# Patient Record
Sex: Female | Born: 1950 | Race: White | Hispanic: No | Smoking: Former smoker
Health system: Southern US, Community
[De-identification: ages and names within clinical notes are randomized; demographics above are authoritative.]

## PROBLEM LIST (undated history)

## (undated) DIAGNOSIS — K219 Gastro-esophageal reflux disease without esophagitis: Secondary | ICD-10-CM

## (undated) DIAGNOSIS — J45909 Unspecified asthma, uncomplicated: Secondary | ICD-10-CM

## (undated) DIAGNOSIS — I10 Essential (primary) hypertension: Secondary | ICD-10-CM

## (undated) DIAGNOSIS — M797 Fibromyalgia: Secondary | ICD-10-CM

## (undated) DIAGNOSIS — R519 Headache, unspecified: Secondary | ICD-10-CM

## (undated) DIAGNOSIS — K759 Inflammatory liver disease, unspecified: Secondary | ICD-10-CM

## (undated) HISTORY — PX: ABDOMINAL HYSTERECTOMY: SHX81

## (undated) HISTORY — PX: TUBAL LIGATION: SHX77

---

## 2005-05-10 ENCOUNTER — Ambulatory Visit: Payer: Self-pay | Admitting: Unknown Physician Specialty

## 2006-11-28 ENCOUNTER — Ambulatory Visit: Payer: Self-pay | Admitting: Family Medicine

## 2008-01-18 ENCOUNTER — Ambulatory Visit: Payer: Self-pay | Admitting: Family Medicine

## 2008-05-01 ENCOUNTER — Ambulatory Visit: Payer: Self-pay | Admitting: Obstetrics and Gynecology

## 2008-05-06 ENCOUNTER — Inpatient Hospital Stay: Payer: Self-pay | Admitting: Obstetrics and Gynecology

## 2009-01-27 ENCOUNTER — Ambulatory Visit: Payer: Self-pay | Admitting: Family Medicine

## 2010-01-29 ENCOUNTER — Ambulatory Visit: Payer: Self-pay | Admitting: Nurse Practitioner

## 2011-03-22 ENCOUNTER — Ambulatory Visit: Payer: Self-pay | Admitting: Family Medicine

## 2011-06-08 ENCOUNTER — Ambulatory Visit: Payer: Self-pay

## 2011-07-12 ENCOUNTER — Ambulatory Visit: Payer: Self-pay | Admitting: Neurology

## 2011-07-12 LAB — CREATININE, SERUM
EGFR (African American): >60
EGFR (Non-African Amer.): >60

## 2012-03-22 ENCOUNTER — Ambulatory Visit: Payer: Self-pay | Admitting: Nurse Practitioner

## 2012-03-30 ENCOUNTER — Ambulatory Visit: Payer: Self-pay | Admitting: Nurse Practitioner

## 2012-09-21 ENCOUNTER — Ambulatory Visit: Payer: Self-pay | Admitting: Nurse Practitioner

## 2012-10-03 ENCOUNTER — Ambulatory Visit: Payer: Self-pay | Admitting: Nurse Practitioner

## 2013-09-25 ENCOUNTER — Ambulatory Visit: Payer: Self-pay | Admitting: Nurse Practitioner

## 2013-10-01 ENCOUNTER — Ambulatory Visit: Payer: Self-pay

## 2013-10-12 ENCOUNTER — Ambulatory Visit: Payer: Self-pay

## 2014-09-13 ENCOUNTER — Other Ambulatory Visit: Payer: Self-pay | Admitting: Nurse Practitioner

## 2014-09-13 DIAGNOSIS — Z1239 Encounter for other screening for malignant neoplasm of breast: Secondary | ICD-10-CM

## 2014-10-01 ENCOUNTER — Ambulatory Visit: Payer: Self-pay

## 2014-10-02 ENCOUNTER — Ambulatory Visit
Admission: RE | Admit: 2014-10-02 | Discharge: 2014-10-02 | Disposition: A | Discharge status: Home / Self Care | Payer: BC Managed Care – PPO | Source: Ambulatory Visit | Attending: Nurse Practitioner | Admitting: Nurse Practitioner

## 2014-10-02 DIAGNOSIS — Z1231 Encounter for screening mammogram for malignant neoplasm of breast: Secondary | ICD-10-CM | POA: Insufficient documentation

## 2014-10-02 DIAGNOSIS — Z1239 Encounter for other screening for malignant neoplasm of breast: Secondary | ICD-10-CM

## 2015-10-28 ENCOUNTER — Other Ambulatory Visit: Payer: Self-pay | Admitting: Nurse Practitioner

## 2015-10-28 DIAGNOSIS — Z1231 Encounter for screening mammogram for malignant neoplasm of breast: Secondary | ICD-10-CM

## 2015-11-06 ENCOUNTER — Ambulatory Visit
Admission: RE | Admit: 2015-11-06 | Discharge: 2015-11-06 | Disposition: A | Discharge status: Home / Self Care | Payer: BC Managed Care – PPO | Source: Ambulatory Visit | Attending: Nurse Practitioner | Admitting: Nurse Practitioner

## 2015-11-06 DIAGNOSIS — Z1231 Encounter for screening mammogram for malignant neoplasm of breast: Secondary | ICD-10-CM | POA: Diagnosis present

## 2016-09-30 ENCOUNTER — Other Ambulatory Visit: Payer: Self-pay | Admitting: Nurse Practitioner

## 2016-09-30 DIAGNOSIS — Z1231 Encounter for screening mammogram for malignant neoplasm of breast: Secondary | ICD-10-CM

## 2016-11-09 ENCOUNTER — Ambulatory Visit
Admission: RE | Admit: 2016-11-09 | Discharge: 2016-11-09 | Disposition: A | Discharge status: Home / Self Care | Payer: Medicare Other | Source: Ambulatory Visit | Attending: Nurse Practitioner | Admitting: Nurse Practitioner

## 2016-11-09 DIAGNOSIS — Z1231 Encounter for screening mammogram for malignant neoplasm of breast: Secondary | ICD-10-CM

## 2017-01-18 ENCOUNTER — Emergency Department: Payer: Medicare Other

## 2017-01-18 ENCOUNTER — Emergency Department
Admission: EM | Admit: 2017-01-18 | Discharge: 2017-01-18 | Disposition: A | Discharge status: Home / Self Care | Payer: Medicare Other | Attending: Student in an Organized Health Care Education/Training Program | Admitting: Student in an Organized Health Care Education/Training Program

## 2017-01-18 ENCOUNTER — Encounter: Payer: Self-pay | Admitting: Emergency Medicine

## 2017-01-18 DIAGNOSIS — R079 Chest pain, unspecified: Secondary | ICD-10-CM | POA: Diagnosis present

## 2017-01-18 DIAGNOSIS — I10 Essential (primary) hypertension: Secondary | ICD-10-CM | POA: Diagnosis not present

## 2017-01-18 HISTORY — DX: Fibromyalgia: M79.7

## 2017-01-18 HISTORY — DX: Essential (primary) hypertension: I10

## 2017-01-18 HISTORY — DX: Gastro-esophageal reflux disease without esophagitis: K21.9

## 2017-01-18 LAB — CBC
HCT: 44.5 % (ref 35.0–47.0)
Hemoglobin: 15.4 g/dL (ref 12.0–16.0)
MCH: 33.6 pg (ref 26.0–34.0)
MCHC: 34.6 g/dL (ref 32.0–36.0)
MCV: 97.1 fL (ref 80.0–100.0)
Platelets: 263 10*3/uL (ref 150–440)
RBC: 4.58 MIL/uL (ref 3.80–5.20)
RDW: 13.3 % (ref 11.5–14.5)
WBC: 5.7 10*3/uL (ref 3.6–11.0)

## 2017-01-18 LAB — BASIC METABOLIC PANEL
Anion gap: 8 (ref 5–15)
BUN: 18 mg/dL (ref 6–20)
CALCIUM: 9.3 mg/dL (ref 8.9–10.3)
CO2: 28 mmol/L (ref 22–32)
CREATININE: 0.75 mg/dL (ref 0.44–1.00)
Chloride: 100 mmol/L — ABNORMAL LOW (ref 101–111)
GFR calc Af Amer: >60 mL/min (ref >60)
GLUCOSE: 92 mg/dL (ref 65–99)
Potassium: 3.8 mmol/L (ref 3.5–5.1)
Sodium: 136 mmol/L (ref 135–145)

## 2017-01-18 LAB — FIBRIN DERIVATIVES D-DIMER (ARMC ONLY): FIBRIN DERIVATIVES D-DIMER (ARMC): 312.2 (ref 0.00–499.00)

## 2017-01-18 LAB — TROPONIN I: Troponin I: <0.03 ng/mL (ref <0.03)

## 2017-01-18 MED ORDER — AMLODIPINE BESYLATE 5 MG PO TABS: 5.0000 mg | ORAL_TABLET | Freq: Once | ORAL | Status: DC

## 2017-01-18 NOTE — ED Triage Notes (Signed)
Arrives with c/o intermittent chest pains today.  States pains worse when taking a deep breath.

## 2017-01-18 NOTE — ED Notes (Signed)
Warm blankets provided to pt and family

## 2017-01-18 NOTE — ED Provider Notes (Signed)
[facility] Emergency Department Provider Note    First MD Initiated Contact with Patient 01/18/17 2051     (approximate)  I have reviewed the triage vital signs and the nursing notes.   HISTORY  Chief Complaint Chest Pain    HPI Madeline Powell is a 66 y.o. female presents with chief complaint of sharp stabbing midsternal chest pain without radiation that started this morning. Patient states that pain lasts roughly seconds to minutes. There is no aching or pressure. No fevers or shortness of breath. States she's had multiple of these episodes throughout the day. No palpitations. No pain or discomfort radiating to her jaw or neck. Denies any abdominal pain. No constipation or diarrhea. No previous history of heart attacks. Does have a history of hypertension no history of diabetes or colic cholesterol. She does not smoke. Does have a history of fibromyalgia.   Past Medical History:  Diagnosis Date  . Fibromyalgia   . GERD (gastroesophageal reflux disease)   . Hypertension    Family History  Problem Relation Age of Onset  . Breast cancer Neg Hx    Past Surgical History:  Procedure Laterality Date  . ABDOMINAL HYSTERECTOMY    . TUBAL LIGATION     There are no active problems to display for this patient.     Prior to Admission medications   Not on File    Allergies Dilaudid [hydromorphone hcl] and Morphine and related    Social History Social History  Substance Use Topics  . Smoking status: Never Smoker  . Smokeless tobacco: Never Used  . Alcohol use Not on file    Review of Systems Patient denies headaches, rhinorrhea, blurry vision, numbness, shortness of breath, chest pain, edema, cough, abdominal pain, nausea, vomiting, diarrhea, dysuria, fevers, rashes or hallucinations unless otherwise stated above in HPI. ____________________________________________   PHYSICAL EXAM:  VITAL SIGNS: Vitals:   01/18/17 2230 01/18/17 2318    BP: 130/88 (!) 155/89  Pulse: 70 72  Resp:  16  Temp:    SpO2: 98% 94%    Constitutional: Alert and oriented. Well appearing and in no acute distress. Eyes: Conjunctivae are normal.  Head: Atraumatic. Nose: No congestion/rhinnorhea. Mouth/Throat: Mucous membranes are moist.   Neck: No stridor. Painless ROM.  Cardiovascular: Normal rate, regular rhythm. Grossly normal heart sounds.  Good peripheral circulation.  + ttp of midsternum Respiratory: Normal respiratory effort.  No retractions. Lungs CTAB. Gastrointestinal: Soft and nontender. No distention. No abdominal bruits. No CVA tenderness. Genitourinary:  Musculoskeletal: No lower extremity tenderness nor edema.  No joint effusions. Neurologic:  Normal speech and language. No gross focal neurologic deficits are appreciated. No facial droop Skin:  Skin is warm, dry and intact. No rash noted. Psychiatric: Mood and affect are normal. Speech and behavior are normal.  ____________________________________________   LABS (all labs ordered are listed, but only abnormal results are displayed)  Results for orders placed or performed during the hospital encounter of 01/18/17 (from the past 24 hour(s))  Basic metabolic panel     Status: Abnormal   Collection Time: 01/18/17  7:25 PM  Result Value Ref Range   Sodium 136 135 - 145 mmol/L   Potassium 3.8 3.5 - 5.1 mmol/L   Chloride 100 (L) 101 - 111 mmol/L   CO2 28 22 - 32 mmol/L   Glucose, Bld 92 65 - 99 mg/dL   BUN 18 6 - 20 mg/dL   Creatinine, Ser 1.61 0.44 - 1.00 mg/dL   Calcium  9.3 8.9 - 10.3 mg/dL   GFR calc non Af Amer >60 >60 mL/min   GFR calc Af Amer >60 >60 mL/min   Anion gap 8 5 - 15  CBC     Status: None   Collection Time: 01/18/17  7:25 PM  Result Value Ref Range   WBC 5.7 3.6 - 11.0 K/uL   RBC 4.58 3.80 - 5.20 MIL/uL   Hemoglobin 15.4 12.0 - 16.0 g/dL   HCT 40.9 81.1 - 91.4 %   MCV 97.1 80.0 - 100.0 fL   MCH 33.6 26.0 - 34.0 pg   MCHC 34.6 32.0 - 36.0 g/dL   RDW  78.2 95.6 - 21.3 %   Platelets 263 150 - 440 K/uL  Troponin I     Status: None   Collection Time: 01/18/17  7:25 PM  Result Value Ref Range   Troponin I <0.03 <0.03 ng/mL  Fibrin derivatives D-Dimer ([facility] only)     Status: None   Collection Time: 01/18/17  9:11 PM  Result Value Ref Range   Fibrin derivatives D-dimer ([facility]) 312.20 0.00 - 499.00  Troponin I     Status: None   Collection Time: 01/18/17  9:11 PM  Result Value Ref Range   Troponin I <0.03 <0.03 ng/mL   ____________________________________________  EKG My review and personal interpretation at Time: 18:54   Indication: chest pain  Rate: 70  Rhythm: sinus Axis: normal Other: normal intervals, no stemi, no depressions, poor rwave progression ____________________________________________  RADIOLOGY  I personally reviewed all radiographic images ordered to evaluate for the above acute complaints and reviewed radiology reports and findings.  These findings were personally discussed with the patient.  Please see medical record for radiology report.  ____________________________________________   PROCEDURES  Procedure(s) performed:  Procedures    Critical Care performed: no ____________________________________________   INITIAL IMPRESSION / ASSESSMENT AND PLAN / ED COURSE  Pertinent labs & imaging results that were available during my care of the patient were reviewed by me and considered in my medical decision making (see chart for details).  DDX: ACS, pericarditis, esophagitis, boerhaaves, pe, dissection, pna, bronchitis, costochondritis   Madeline Powell is a 66 y.o. who presents to the ED with Atypical chest pain as described above. Patient is well-appearing and much younger than stated age.  Patient is AFVSS in ED. Exam as above. Given current presentation have considered the above differential.  Abdominal exam soft and benign.  Heart score of 3 versus 4 based on subjectivity (08657), will further evaluate with  repeat trop  Patient low risk by wells, will further risk stratify with d-dimer.   Clinical Course as of Jan 19 2356  Tue Jan 18, 2017  2238 Hemoglobin: 15.4 [PR]  2312 Repeat trop and d-dimer negative.  Patient remains cehst pain free.  At this point, I do feel that she is appropriate for close follow up with pcp and referral to cardiology.  Have discussed with the patient and available family all diagnostics and treatments performed thus far and all questions were answered to the best of my ability. The patient demonstrates understanding and agreement with plan.   [PR]    Clinical Course User Index [PR] Willy Eddy, MD     ____________________________________________   FINAL CLINICAL IMPRESSION(S) / ED DIAGNOSES  Final diagnoses:  Chest pain, unspecified type      NEW MEDICATIONS STARTED DURING THIS VISIT:  There are no discharge medications for this patient.    Note:  This document was  prepared using Conservation officer, historic buildings and may include unintentional dictation errors.    Willy Eddy, MD 01/18/17 [phone]

## 2017-10-05 ENCOUNTER — Other Ambulatory Visit: Payer: Self-pay | Admitting: Nurse Practitioner

## 2017-10-05 DIAGNOSIS — Z1231 Encounter for screening mammogram for malignant neoplasm of breast: Secondary | ICD-10-CM

## 2017-11-14 ENCOUNTER — Ambulatory Visit
Admission: RE | Admit: 2017-11-14 | Discharge: 2017-11-14 | Disposition: A | Discharge status: Home / Self Care | Payer: Medicare Other | Source: Ambulatory Visit | Attending: Nurse Practitioner | Admitting: Nurse Practitioner

## 2017-11-14 DIAGNOSIS — Z1231 Encounter for screening mammogram for malignant neoplasm of breast: Secondary | ICD-10-CM | POA: Diagnosis not present

## 2018-02-28 HISTORY — PX: REDUCTION MAMMAPLASTY: SUR839

## 2018-10-24 ENCOUNTER — Other Ambulatory Visit: Payer: Self-pay | Admitting: Nurse Practitioner

## 2018-10-24 DIAGNOSIS — Z1231 Encounter for screening mammogram for malignant neoplasm of breast: Secondary | ICD-10-CM

## 2018-11-20 ENCOUNTER — Other Ambulatory Visit: Payer: Self-pay

## 2018-11-20 ENCOUNTER — Ambulatory Visit
Admission: RE | Admit: 2018-11-20 | Discharge: 2018-11-20 | Disposition: A | Discharge status: Home / Self Care | Payer: Medicare Other | Source: Ambulatory Visit | Attending: Nurse Practitioner | Admitting: Nurse Practitioner

## 2018-11-20 ENCOUNTER — Encounter (INDEPENDENT_AMBULATORY_CARE_PROVIDER_SITE_OTHER): Payer: Self-pay

## 2018-11-20 DIAGNOSIS — Z1231 Encounter for screening mammogram for malignant neoplasm of breast: Secondary | ICD-10-CM | POA: Diagnosis present

## 2019-07-06 ENCOUNTER — Ambulatory Visit: Payer: Medicare PPO | Attending: Internal Medicine

## 2019-07-06 DIAGNOSIS — Z23 Encounter for immunization: Secondary | ICD-10-CM

## 2019-07-06 NOTE — Progress Notes (Signed)
   Covid-19 Vaccination Clinic  Name:  Madeline Powell    MRN: [id number] DOB: 09-Apr-1951  07/06/2019  [name] was observed post Covid-19 immunization for 15 minutes without incidence. She was provided with Vaccine Information Sheet and instruction to access the V-Safe system.   [name] was instructed to call 911 with any severe reactions post vaccine: Marland Kitchen Difficulty breathing  . Swelling of your face and throat  . A fast heartbeat  . A bad rash all over your body  . Dizziness and weakness    Immunizations Administered    Name Date Dose VIS Date Route   Moderna COVID-19 Vaccine 07/06/2019  2:11 PM 0.5 mL 05/01/2019 Intramuscular   Manufacturer: Moderna   Lot: [id number]   NDC: 78375-423-70

## 2019-08-07 ENCOUNTER — Ambulatory Visit: Payer: Medicare PPO | Attending: Internal Medicine

## 2019-08-07 DIAGNOSIS — Z23 Encounter for immunization: Secondary | ICD-10-CM | POA: Insufficient documentation

## 2019-08-07 NOTE — Progress Notes (Signed)
   Covid-19 Vaccination Clinic  Name:  Madeline Powell    MRN: [id number] DOB: 1950/10/19  08/07/2019  [name] was observed post Covid-19 immunization for 30 minutes based on pre-vaccination screening without incident. She was provided with Vaccine Information Sheet and instruction to access the V-Safe system.   [name] was instructed to call 911 with any severe reactions post vaccine: Marland Kitchen Difficulty breathing  . Swelling of face and throat  . A fast heartbeat  . A bad rash all over body  . Dizziness and weakness   Immunizations Administered    Name Date Dose VIS Date Route   Moderna COVID-19 Vaccine 08/07/2019  1:55 PM 0.5 mL 05/01/2019 Intramuscular   Manufacturer: Moderna   Lot: [id number]   NDC: 27078-675-44

## 2019-10-26 ENCOUNTER — Other Ambulatory Visit: Payer: Self-pay | Admitting: Nurse Practitioner

## 2019-10-26 DIAGNOSIS — Z1231 Encounter for screening mammogram for malignant neoplasm of breast: Secondary | ICD-10-CM

## 2019-11-26 ENCOUNTER — Ambulatory Visit: Payer: Medicare PPO

## 2020-01-03 ENCOUNTER — Ambulatory Visit
Admission: RE | Admit: 2020-01-03 | Discharge: 2020-01-03 | Disposition: A | Discharge status: Home / Self Care | Payer: Medicare PPO | Source: Ambulatory Visit | Attending: Nurse Practitioner | Admitting: Nurse Practitioner

## 2020-01-03 ENCOUNTER — Other Ambulatory Visit: Payer: Self-pay

## 2020-01-03 DIAGNOSIS — Z1231 Encounter for screening mammogram for malignant neoplasm of breast: Secondary | ICD-10-CM | POA: Insufficient documentation

## 2020-12-02 ENCOUNTER — Encounter (INDEPENDENT_AMBULATORY_CARE_PROVIDER_SITE_OTHER): Payer: Self-pay

## 2020-12-02 ENCOUNTER — Other Ambulatory Visit: Payer: Self-pay | Admitting: Nurse Practitioner

## 2020-12-02 DIAGNOSIS — Z1231 Encounter for screening mammogram for malignant neoplasm of breast: Secondary | ICD-10-CM

## 2021-01-05 ENCOUNTER — Other Ambulatory Visit: Payer: Self-pay

## 2021-01-05 ENCOUNTER — Ambulatory Visit
Admission: RE | Admit: 2021-01-05 | Discharge: 2021-01-05 | Disposition: A | Discharge status: Home / Self Care | Payer: Medicare PPO | Source: Ambulatory Visit | Attending: Nurse Practitioner | Admitting: Nurse Practitioner

## 2021-01-05 DIAGNOSIS — Z1231 Encounter for screening mammogram for malignant neoplasm of breast: Secondary | ICD-10-CM | POA: Diagnosis present

## 2021-02-18 ENCOUNTER — Other Ambulatory Visit: Payer: Self-pay

## 2021-02-18 ENCOUNTER — Ambulatory Visit
Admission: EM | Admit: 2021-02-18 | Discharge: 2021-02-18 | Disposition: A | Discharge status: Home / Self Care | Payer: Medicare PPO

## 2021-02-18 DIAGNOSIS — S61011A Laceration without foreign body of right thumb without damage to nail, initial encounter: Secondary | ICD-10-CM | POA: Diagnosis not present

## 2021-02-18 NOTE — ED Provider Notes (Addendum)
[facility]    CSN: [id number] Arrival date & time: 02/18/21  1855      History   Chief Complaint Chief Complaint  Patient presents with   Laceration    Right thumb     HPI Madeline Powell is a 70 y.o. female presenting for laceration of the right thumb.  Patient says she was cutting some potatoes on a mandolin about 10 minutes prior to arrival to urgent care.  She says the bleeding has continued and she has gone through 2 rags.  She does have full range of motion of her finger and denies any numbness.  She says she only has pain when she moves the thumb.  She is up-to-date with her tetanus immunization.  No other complaints.  HPI  Past Medical History:  Diagnosis Date   Fibromyalgia    GERD (gastroesophageal reflux disease)    Hypertension     There are no problems to display for this patient.   Past Surgical History:  Procedure Laterality Date   ABDOMINAL HYSTERECTOMY     REDUCTION MAMMAPLASTY Bilateral 02/2018   TUBAL LIGATION      OB History   No obstetric history on file.      Home Medications    Prior to Admission medications   Medication Sig Start Date End Date Taking? Authorizing Provider  albuterol (VENTOLIN HFA) 108 (90 Base) MCG/ACT inhaler Inhale into the lungs. 06/18/19  Yes [provider]  buPROPion (WELLBUTRIN SR) 150 MG 12 hr tablet Take 150 mg by mouth 2 (two) times daily. 02/08/21  Yes [provider]  Calcium Carb-Cholecalciferol 600-400 MG-UNIT TABS Take by mouth.   Yes [provider]  estradiol (ESTRACE) 1 MG tablet Take 1 mg by mouth every other day. 12/25/20  Yes [provider]  FLOVENT HFA 110 MCG/ACT inhaler SMARTSIG:2 Inhalation Via Inhaler Twice Daily 11/27/20  Yes [provider]  fluticasone (FLONASE) 50 MCG/ACT nasal spray Place into the nose. 04/16/20  Yes [provider]  gabapentin (NEURONTIN) 100 MG capsule Take 100 mg by mouth 3 (three) times daily. 01/23/21  Yes  [provider]  hydrochlorothiazide (HYDRODIURIL) 25 MG tablet Take 25 mg by mouth daily. 02/08/21  Yes [provider]  ibuprofen (ADVIL) 200 MG tablet Take by mouth.   Yes [provider]  loratadine (CLARITIN) 10 MG tablet Take by mouth.   Yes [provider]  omeprazole (PRILOSEC) 40 MG capsule Take by mouth. 04/16/20  Yes [provider]  RESTASIS 0.05 % ophthalmic emulsion 1 drop 2 (two) times daily. 02/09/21  Yes [provider]    Family History Family History  Problem Relation Age of Onset   Breast cancer Neg Hx     Social History Social History   Tobacco Use   Smoking status: Never   Smokeless tobacco: Never  Vaping Use   Vaping Use: Never used  Substance Use Topics   Alcohol use: Never   Drug use: Never     Allergies   Dilaudid [hydromorphone hcl] and Morphine and related   Review of Systems Review of Systems  Musculoskeletal:  Negative for arthralgias and joint swelling.  Skin:  Positive for wound. Negative for color change.  Neurological:  Negative for weakness and numbness.    Physical Exam Triage Vital Signs ED Triage Vitals  Enc Vitals Group     BP 02/18/21 1909 (!) 148/94     Pulse Rate 02/18/21 1909 75     Resp 02/18/21  1909 18     Temp 02/18/21 1909 98.2 F (36.8 C)     Temp Source 02/18/21 1909 Oral     SpO2 02/18/21 1909 100 %     Weight 02/18/21 1906 180 lb (81.6 kg)     Height 02/18/21 1906 5\' 2"  (1.575 m)     Head Circumference --      Peak Flow --      Pain Score 02/18/21 1905 0     Pain Loc --      Pain Edu? --      Excl. in GC? --    No data found.  Updated Vital Signs BP (!) 148/94 (BP Location: Left Arm)   Pulse 75   Temp 98.2 F (36.8 C) (Oral)   Resp 18   Ht 5\' 2"  (1.575 m)   Wt 180 lb (81.6 kg)   SpO2 100%   BMI 32.92 kg/m      Physical Exam Vitals and nursing note reviewed.  Constitutional:      General: She is not in acute distress.    Appearance: Normal  appearance. She is not ill-appearing or toxic-appearing.  HENT:     Head: Normocephalic and atraumatic.  Eyes:     General: No scleral icterus.       Right eye: No discharge.        Left eye: No discharge.     Conjunctiva/sclera: Conjunctivae normal.  Cardiovascular:     Rate and Rhythm: Normal rate.     Pulses: Normal pulses.  Pulmonary:     Effort: Pulmonary effort is normal. No respiratory distress.  Musculoskeletal:     Cervical back: Neck supple.  Skin:    General: Skin is dry.     Comments: 2.5 cm flap laceration right thumb. Bleeding controlled. Full ROM of thumb. Good pulses and sensation. No FB or contaminants.   Neurological:     General: No focal deficit present.     Mental Status: She is alert. Mental status is at baseline.     Motor: No weakness.     Coordination: Coordination normal.     Gait: Gait normal.  Psychiatric:        Mood and Affect: Mood normal.        Behavior: Behavior normal.        Thought Content: Thought content normal.     UC Treatments / Results  Labs (all labs ordered are listed, but only abnormal results are displayed) Labs Reviewed - No data to display  EKG   Radiology No results found.  Procedures Laceration Repair  Date/Time: 02/18/2021 8:14 PM Performed by: , PA-C Authorized by: 02/20/2021, PA-C   Consent:    Consent obtained:  Verbal   Consent given by:  Patient   Risks discussed:  Infection, pain, retained foreign body, poor cosmetic result and poor wound healing Universal protocol:    Patient identity confirmed:  Verbally with patient Anesthesia:    Anesthesia method:  Local infiltration   Local anesthetic:  Lidocaine 1% w/o epi Laceration details:    Location:  Finger   Finger location:  R thumb   Length (cm):  2.5 Exploration:    Hemostasis achieved with:  Direct pressure and tourniquet   Contaminated: no   Treatment:    Area cleansed with:  Saline   Amount of cleaning:  Standard    Irrigation solution:  Sterile saline Skin repair:    Repair method:  Sutures   Suture  size:  5-0   Suture technique:  Simple interrupted   Number of sutures:  10 Approximation:    Approximation:  Close Repair type:    Repair type:  Simple Post-procedure details:    Dressing:  Antibiotic ointment and non-adherent dressing   Procedure completion:  Tolerated well, no immediate complications (including critical care time)  Medications Ordered in UC Medications - No data to display  Initial Impression / Assessment and Plan / UC Course  I have reviewed the triage vital signs and the nursing notes.  Pertinent labs & imaging results that were available during my care of the patient were reviewed by me and considered in my medical decision making (see chart for details).  70 year old female presenting for laceration of right thumb that occurred 10 minutes prior to arrival to urgent care.  She was cut with a clean knife blade.  She does have a 2.5 cm flap laceration.  Full range of motion of the finger and normal sensation and pulses.  Laceration repaired after verbal consent obtained.  Used 5-0 sutures and placed 10 simple sutures.  Patient tolerated well.  Area cleaned and applied topical triple antibiotic ointment, nonadherent dressing and Coban.  Reviewed wound care guidelines and advised to follow-up in 10 days for suture removal or sooner for signs of infection.  Final Clinical Impressions(s) / UC Diagnoses   Final diagnoses:  Laceration of right thumb without foreign body without damage to nail, initial encounter     Discharge Instructions      -You have 10 sutures in place.  Do not get them wet for the first 2 days and then after that clean gently with soap and water.  Do not scrub. -Look out for any infection such as increased redness, swelling, pain or pustular drainage.  If you notice any signs of infection, you should be seen again. -You can have Tylenol or Motrin as needed for  discomfort.  Keep hand elevated to help with swelling. -Return in 10 days to have sutures removed.     ED Prescriptions   None    PDMP not reviewed this encounter.   Shirlee Latch, PA-C 02/18/21 2017    Eusebio Friendly B, PA-C 02/18/21 2017

## 2021-02-18 NOTE — ED Triage Notes (Signed)
Patient reports she was cutting up sweet potatoes with a mandolin and cut her right thumb. Happened about 10 min ago.

## 2021-02-18 NOTE — Discharge Instructions (Addendum)
-  You have 10 sutures in place.  Do not get them wet for the first 2 days and then after that clean gently with soap and water.  Do not scrub. -Look out for any infection such as increased redness, swelling, pain or pustular drainage.  If you notice any signs of infection, you should be seen again. -You can have Tylenol or Motrin as needed for discomfort.  Keep hand elevated to help with swelling. -Return in 10 days to have sutures removed.

## 2021-02-27 ENCOUNTER — Ambulatory Visit
Admission: EM | Admit: 2021-02-27 | Discharge: 2021-02-27 | Disposition: A | Discharge status: Home / Self Care | Payer: Medicare PPO

## 2021-02-27 ENCOUNTER — Other Ambulatory Visit: Payer: Self-pay

## 2021-02-27 DIAGNOSIS — Z4802 Encounter for removal of sutures: Secondary | ICD-10-CM

## 2021-04-17 ENCOUNTER — Other Ambulatory Visit: Payer: Self-pay | Admitting: Nurse Practitioner

## 2021-04-17 DIAGNOSIS — R1011 Right upper quadrant pain: Secondary | ICD-10-CM

## 2021-04-22 ENCOUNTER — Other Ambulatory Visit: Payer: Self-pay

## 2021-04-22 ENCOUNTER — Ambulatory Visit
Admission: RE | Admit: 2021-04-22 | Discharge: 2021-04-22 | Disposition: A | Discharge status: Home / Self Care | Payer: Medicare PPO | Source: Ambulatory Visit | Attending: Nurse Practitioner | Admitting: Nurse Practitioner

## 2021-04-22 DIAGNOSIS — R1011 Right upper quadrant pain: Secondary | ICD-10-CM | POA: Diagnosis not present

## 2021-05-05 ENCOUNTER — Ambulatory Visit: Payer: Self-pay | Admitting: General Surgery

## 2021-05-05 NOTE — H&P (Signed)
PATIENT PROFILE: Madeline Powell is a 70 y.o. female who presents to the Clinic for consultation at the request of Gauger, NP for evaluation of cholelithiasis.  PCP:  Sallee Lange, NP  HISTORY OF PRESENT ILLNESS: Madeline Powell reports she has been having intermittent right upper quadrant pain.  Pain localized to the right upper quadrant.  Pain does not radiate to other part of body.  She has been tried to find any aggravating factor but has not been able to identify a specific type of food.  Denies any alleviating factors.  First episode 2 to 3 years ago she thought that it was a heart attack.  Pain has been intermittent every 6 to 8 weeks.  Those episodes are severe in intensity but they resolved by itself.  She discussed this symptoms with PCP and abdominal ultrasound was done.  Abdominal ultrasound shows gallstones without sign of cholecystitis.  I personally evaluated the images.  Denies any episode of fever or chills.   PROBLEM LIST: Problem List  Date Reviewed: 04/17/2021          Noted   Chilblains Unknown   Overview    Dx by Dr. Phillip Heal.  Sx mainly in feet.      Glaucoma (increased eye pressure) Unknown   Asthma without status asthmaticus, unspecified Unknown   Allergic rhinitis Unknown   Fibromyalgia Unknown   Osteopenia Unknown   GERD (gastroesophageal reflux disease) Unknown   Hypertension Unknown   IBS (irritable bowel syndrome) Unknown   Overview    Possible dx w/ gas & bloating       GENERAL REVIEW OF SYSTEMS:   General ROS: negative for - chills, fatigue, fever, weight gain or weight loss Allergy and Immunology ROS: negative for - hives  Hematological and Lymphatic ROS: negative for - bleeding problems or bruising, negative for palpable nodes Endocrine ROS: negative for - heat or cold intolerance, hair changes Respiratory ROS: negative for - cough, shortness of breath or wheezing Cardiovascular ROS: no chest pain or palpitations GI ROS: negative for  nausea, vomiting, diarrhea, constipation.  Positive for abdominal pain Musculoskeletal ROS: negative for - joint swelling or muscle pain Neurological ROS: negative for - confusion, syncope Dermatological ROS: negative for pruritus and rash Psychiatric: negative for anxiety, depression, difficulty sleeping and memory loss  MEDICATIONS: Current Outpatient Medications  Medication Sig Dispense Refill   albuterol 90 mcg/actuation inhaler Inhale 2 inhalations into the lungs every 6 (six) hours as needed for Wheezing 3 Inhaler 1   betamethasone valerate (VALISONE) 0.1 % cream Apply topically 2 (two) times daily as needed 30 g 0   buPROPion (WELLBUTRIN SR) 150 MG SR tablet Take 1 tablet (150 mg total) by mouth 2 (two) times daily 180 tablet 3   calcium carbonate-vitamin D3 (CALTRATE 600+D) 600 mg(1,531m) -400 unit tablet Take 1 tablet by mouth 2 (two) times daily with meals.     cycloSPORINE (RESTASIS) 0.05 % ophthalmic emulsion Place 1 drop into both eyes 2 (two) times daily.     dorzolamide-timolol (COSOPT) 22.3-6.8 mg/mL ophthalmic solution Place 1 drop into both eyes 2 (two) times daily.     estradioL (ESTRACE) 1 MG tablet Take 1 tablet (1 mg total) by mouth every other day 45 tablet 3   fluticasone propionate (FLONASE) 50 mcg/actuation nasal spray Place 2 sprays into both nostrils once daily as needed 48 g 3   fluticasone propionate (FLOVENT HFA) 110 mcg/actuation inhaler Inhale 2 inhalations into the lungs 2 (two) times daily 36 g  3   gabapentin (NEURONTIN) 100 MG capsule Take 1 capsule (100 mg total) by mouth 3 (three) times daily 270 capsule 3   hydroCHLOROthiazide (HYDRODIURIL) 25 MG tablet Take 1 tablet (25 mg total) by mouth once daily 90 tablet 3   ibuprofen (ADVIL,MOTRIN) 200 MG tablet Take 600 mg by mouth nightly as needed for Pain.     loratadine (CLARITIN) 10 mg tablet Take 10 mg by mouth once daily.     montelukast (SINGULAIR) 10 mg tablet Take 1 tablet (10 mg total) by mouth at  bedtime for 90 days 90 tablet 1   omega-3 fatty acids/fish oil 340-1,000 mg capsule Take 1 capsule by mouth once daily.     omeprazole (PRILOSEC) 40 MG DR capsule TAKE 1 CAPSULE BY MOUTH EVERY DAY 90 capsule 3   SACCHAROMYCES BOULARDII (PROBIOTIC, S.BOULARDII, ORAL) Take 1 tablet by mouth once daily.       No current facility-administered medications for this visit.    ALLERGIES: Dilaudid [hydromorphone], Morphine, and Prozac [fluoxetine]  PAST MEDICAL HISTORY: Past Medical History:  Diagnosis Date   Allergic rhinitis    Allergy    Environmental   Asthma without status asthmaticus, unspecified    Chilblains    Dx by Dr. Phillip Heal.  Sx mainly in feet.   Fibromyalgia    Gallstones 03/2021   GERD (gastroesophageal reflux disease)    Glaucoma (increased eye pressure)    Hepatic steatosis 03/2021   Hepatitis A    Hypertension    IBS (irritable bowel syndrome)    Possible dx w/ gas & bloating   Insomnia    Osteopenia    Seborrhea     PAST SURGICAL HISTORY: Past Surgical History:  Procedure Laterality Date   COLONOSCOPY  05/10/2005   Int Hemorrhoids, Diverticulosis: CBF 05/2015; Sch'ed 05/14/2015   COLONOSCOPY  06/26/2015   Int Hemorrhoids: CBF 05/2025   EGD  06/26/2015   No repeat per RTE   HYSTERECTOMY  2009   2/2 prolapse   REDUCTION MAMMAPLASTY  2020   TUBAL LIGATION       FAMILY HISTORY: Family History  Problem Relation Age of Onset   Irregular Heart Beat (Arrhythmia) Father 84       Tachy-brady Syndrome s/p BiVICD   Hypothyroidism Mother    High blood pressure (Hypertension) Mother    Hyperlipidemia (Elevated cholesterol) Mother    Osteoarthritis Mother    Thyroid disease Mother    Other Sister        Benign Pituitary Tumor   Stroke Maternal Grandmother        resulted in death 8 mos later   Other Maternal Grandfather        Brain tumor   Colon cancer Maternal Grandfather 86   Lymphoma Paternal Grandmother    Kidney cancer Sister      SOCIAL  HISTORY: Social History   Socioeconomic History   Marital status: Divorced   Number of children: 2  Occupational History   Occupation: Social Worker--Retired 2017    Comment: [employer]  Tobacco Use   Smoking status: Former    Packs/day: 1.00    Years: 20.00    Pack years: 20.00    Types: Cigarettes   Smokeless tobacco: Never  Vaping Use   Vaping Use: Never used  Substance and Sexual Activity   Alcohol use: Yes    Comment: Hard sparkling water 4 cans a wk   Drug use: No   Sexual activity: Not Currently    Partners:  Male    Birth control/protection: Post-menopausal  Social History Narrative   Diet:  Follows Gluten free.    PHYSICAL EXAM: Vitals:   05/05/21 1051  BP: (!) 154/89  Pulse: 69   Body mass index is 32.88 kg/m. Weight: 83.9 kg (185 lb)   GENERAL: Alert, active, oriented x3  HEENT: Pupils equal reactive to light. Extraocular movements are intact. Sclera clear. Palpebral conjunctiva normal red color.Pharynx clear.  NECK: Supple with no palpable mass and no adenopathy.  LUNGS: Sound clear with no rales rhonchi or wheezes.  HEART: Regular rhythm S1 and S2 without murmur.  ABDOMEN: Soft and depressible, nontender with no palpable mass, no hepatomegaly.   EXTREMITIES: Well-developed well-nourished symmetrical with no dependent edema.  NEUROLOGICAL: Awake alert oriented, facial expression symmetrical, moving all extremities.  REVIEW OF DATA: I have reviewed the following data today: Office Visit on 04/17/2021  Component Date Value   WBC (White Blood Cell Co* 04/17/2021 6.4    RBC (Red Blood Cell Coun* 04/17/2021 4.70    Hemoglobin 04/17/2021 15.6 (H)    Hematocrit 04/17/2021 45.3    MCV (Mean Corpuscular Vo* 04/17/2021 96.4    MCH (Mean Corpuscular He* 04/17/2021 33.2 (H)    MCHC (Mean Corpuscular H* 04/17/2021 34.4    Platelet Count 04/17/2021 282    RDW-CV (Red Cell Distrib* 04/17/2021 12.7    MPV (Mean Platelet Volum* 04/17/2021  10.9    Neutrophils 04/17/2021 3.50    Lymphocytes 04/17/2021 2.10    Mixed Count 04/17/2021 0.80    Neutrophil % 04/17/2021 54.8    Lymphocyte % 04/17/2021 32.5    Mixed % 04/17/2021 12.7    Glucose 04/17/2021 111 (H)    Sodium 04/17/2021 137    Potassium 04/17/2021 4.2    Chloride 04/17/2021 101    Carbon Dioxide (CO2) 04/17/2021 27.5    Urea Nitrogen (BUN) 04/17/2021 20    Creatinine 04/17/2021 0.7    Glomerular Filtration Ra* 04/17/2021 82    Calcium 04/17/2021 9.2    AST  04/17/2021 23    ALT  04/17/2021 20    Alk Phos (alkaline Phosp* 04/17/2021 112 (H)    Albumin 04/17/2021 4.1    Bilirubin, Total 04/17/2021 0.5    Protein, Total 04/17/2021 6.6    A/G Ratio 04/17/2021 1.6    Cholesterol, Total 04/17/2021 244 (H)    Triglyceride 04/17/2021 144    HDL (High Density Lipopr* 04/17/2021 80.4    LDL Calculated 04/17/2021 135 (H)    VLDL Cholesterol 04/17/2021 29    Cholesterol/HDL Ratio 04/17/2021 3.0    Thyroid Stimulating Horm* 04/17/2021 2.721    Creatinine, Random Urine 04/17/2021 35.4 (L)    Urine Albumin, Random 04/17/2021 <7    Urine Albumin/Creatinine* 04/17/2021 <19.8      ASSESSMENT: Madeline Powell is a 70 y.o. female presenting for consultation for cholelithiasis.    Patient was oriented about the diagnosis of cholelithiasis. Also oriented about what is the gallbladder, its anatomy and function and the implications of having stones. The patient was oriented about the treatment alternatives (observation vs cholecystectomy). Patient was oriented that a low percentage of patient will continue to have similar pain symptoms even after the gallbladder is removed. Surgical technique (open vs laparoscopic) was discussed. It was also discussed the goals of the surgery (decrease the pain episodes and avoid the risk of cholecystitis) and the risk of surgery including: bleeding, infection, common bile duct injury, stone retention, injury to other organs such as bowel, liver,  stomach, other  complications such as hernia, bowel obstruction among others. Also discussed with patient about anesthesia and its complications such as: reaction to medications, pneumonia, heart complications, death, among others.   Cholelithiasis without cholecystitis [K80.20]  PLAN: 1.  Robotic assisted laparoscopic cholecystectomy (35701) 2.  CBC, CMP done (04/17/21) 3.  Do not take aspirin 5 days before the procedure 4.  Contact us if has any question or concern.   Patient verbalized understanding, all questions were answered, and were agreeable with the plan outlined above.     Herbert Pun, MD  Electronically signed by Herbert Pun, MD

## 2021-05-05 NOTE — H&P (View-Only) (Signed)
PATIENT PROFILE: Madeline Powell is a 70 y.o. female who presents to the Clinic for consultation at the request of Gauger, NP for evaluation of cholelithiasis.  PCP:  Gauger, Sarah Kathryn, NP  HISTORY OF PRESENT ILLNESS: Madeline Powell reports she has been having intermittent right upper quadrant pain.  Pain localized to the right upper quadrant.  Pain does not radiate to other part of body.  She has been tried to find any aggravating factor but has not been able to identify a specific type of food.  Denies any alleviating factors.  First episode 2 to 3 years ago she thought that it was a heart attack.  Pain has been intermittent every 6 to 8 weeks.  Those episodes are severe in intensity but they resolved by itself.  She discussed this symptoms with PCP and abdominal ultrasound was done.  Abdominal ultrasound shows gallstones without sign of cholecystitis.  I personally evaluated the images.  Denies any episode of fever or chills.   PROBLEM LIST: Problem List  Date Reviewed: 04/17/2021          Noted   Chilblains Unknown   Overview    Dx by Dr. Graham.  Sx mainly in feet.      Glaucoma (increased eye pressure) Unknown   Asthma without status asthmaticus, unspecified Unknown   Allergic rhinitis Unknown   Fibromyalgia Unknown   Osteopenia Unknown   GERD (gastroesophageal reflux disease) Unknown   Hypertension Unknown   IBS (irritable bowel syndrome) Unknown   Overview    Possible dx w/ gas & bloating       GENERAL REVIEW OF SYSTEMS:   General ROS: negative for - chills, fatigue, fever, weight gain or weight loss Allergy and Immunology ROS: negative for - hives  Hematological and Lymphatic ROS: negative for - bleeding problems or bruising, negative for palpable nodes Endocrine ROS: negative for - heat or cold intolerance, hair changes Respiratory ROS: negative for - cough, shortness of breath or wheezing Cardiovascular ROS: no chest pain or palpitations GI ROS: negative for  nausea, vomiting, diarrhea, constipation.  Positive for abdominal pain Musculoskeletal ROS: negative for - joint swelling or muscle pain Neurological ROS: negative for - confusion, syncope Dermatological ROS: negative for pruritus and rash Psychiatric: negative for anxiety, depression, difficulty sleeping and memory loss  MEDICATIONS: Current Outpatient Medications  Medication Sig Dispense Refill   albuterol 90 mcg/actuation inhaler Inhale 2 inhalations into the lungs every 6 (six) hours as needed for Wheezing 3 Inhaler 1   betamethasone valerate (VALISONE) 0.1 % cream Apply topically 2 (two) times daily as needed 30 g 0   buPROPion (WELLBUTRIN SR) 150 MG SR tablet Take 1 tablet (150 mg total) by mouth 2 (two) times daily 180 tablet 3   calcium carbonate-vitamin D3 (CALTRATE 600+D) 600 mg(1,500mg) -400 unit tablet Take 1 tablet by mouth 2 (two) times daily with meals.     cycloSPORINE (RESTASIS) 0.05 % ophthalmic emulsion Place 1 drop into both eyes 2 (two) times daily.     dorzolamide-timolol (COSOPT) 22.3-6.8 mg/mL ophthalmic solution Place 1 drop into both eyes 2 (two) times daily.     estradioL (ESTRACE) 1 MG tablet Take 1 tablet (1 mg total) by mouth every other day 45 tablet 3   fluticasone propionate (FLONASE) 50 mcg/actuation nasal spray Place 2 sprays into both nostrils once daily as needed 48 g 3   fluticasone propionate (FLOVENT HFA) 110 mcg/actuation inhaler Inhale 2 inhalations into the lungs 2 (two) times daily 36 g   3   gabapentin (NEURONTIN) 100 MG capsule Take 1 capsule (100 mg total) by mouth 3 (three) times daily 270 capsule 3   hydroCHLOROthiazide (HYDRODIURIL) 25 MG tablet Take 1 tablet (25 mg total) by mouth once daily 90 tablet 3   ibuprofen (ADVIL,MOTRIN) 200 MG tablet Take 600 mg by mouth nightly as needed for Pain.     loratadine (CLARITIN) 10 mg tablet Take 10 mg by mouth once daily.     montelukast (SINGULAIR) 10 mg tablet Take 1 tablet (10 mg total) by mouth at  bedtime for 90 days 90 tablet 1   omega-3 fatty acids/fish oil 340-1,000 mg capsule Take 1 capsule by mouth once daily.     omeprazole (PRILOSEC) 40 MG DR capsule TAKE 1 CAPSULE BY MOUTH EVERY DAY 90 capsule 3   SACCHAROMYCES BOULARDII (PROBIOTIC, S.BOULARDII, ORAL) Take 1 tablet by mouth once daily.       No current facility-administered medications for this visit.    ALLERGIES: Dilaudid [hydromorphone], Morphine, and Prozac [fluoxetine]  PAST MEDICAL HISTORY: Past Medical History:  Diagnosis Date   Allergic rhinitis    Allergy    Environmental   Asthma without status asthmaticus, unspecified    Chilblains    Dx by Dr. Graham.  Sx mainly in feet.   Fibromyalgia    Gallstones 03/2021   GERD (gastroesophageal reflux disease)    Glaucoma (increased eye pressure)    Hepatic steatosis 03/2021   Hepatitis A    Hypertension    IBS (irritable bowel syndrome)    Possible dx w/ gas & bloating   Insomnia    Osteopenia    Seborrhea     PAST SURGICAL HISTORY: Past Surgical History:  Procedure Laterality Date   COLONOSCOPY  05/10/2005   Int Hemorrhoids, Diverticulosis: CBF 05/2015; Sch'ed 05/14/2015   COLONOSCOPY  06/26/2015   Int Hemorrhoids: CBF 05/2025   EGD  06/26/2015   No repeat per RTE   HYSTERECTOMY  2009   2/2 prolapse   REDUCTION MAMMAPLASTY  2020   TUBAL LIGATION       FAMILY HISTORY: Family History  Problem Relation Age of Onset   Irregular Heart Beat (Arrhythmia) Father 87       Tachy-brady Syndrome s/p BiVICD   Hypothyroidism Mother    High blood pressure (Hypertension) Mother    Hyperlipidemia (Elevated cholesterol) Mother    Osteoarthritis Mother    Thyroid disease Mother    Other Sister        Benign Pituitary Tumor   Stroke Maternal Grandmother        resulted in death 4 mos later   Other Maternal Grandfather        Brain tumor   Colon cancer Maternal Grandfather 86   Lymphoma Paternal Grandmother    Kidney cancer Sister      SOCIAL  HISTORY: Social History   Socioeconomic History   Marital status: Divorced   Number of children: 2  Occupational History   Occupation: Social Worker--Retired 2017    Comment: [employer]  Tobacco Use   Smoking status: Former    Packs/day: 1.00    Years: 20.00    Pack years: 20.00    Types: Cigarettes   Smokeless tobacco: Never  Vaping Use   Vaping Use: Never used  Substance and Sexual Activity   Alcohol use: Yes    Comment: Hard sparkling water 4 cans a wk   Drug use: No   Sexual activity: Not Currently    Partners:   Male    Birth control/protection: Post-menopausal  Social History Narrative   Diet:  Follows Gluten free.    PHYSICAL EXAM: Vitals:   05/05/21 1051  BP: (!) 154/89  Pulse: 69   Body mass index is 32.88 kg/m. Weight: 83.9 kg (185 lb)   GENERAL: Alert, active, oriented x3  HEENT: Pupils equal reactive to light. Extraocular movements are intact. Sclera clear. Palpebral conjunctiva normal red color.Pharynx clear.  NECK: Supple with no palpable mass and no adenopathy.  LUNGS: Sound clear with no rales rhonchi or wheezes.  HEART: Regular rhythm S1 and S2 without murmur.  ABDOMEN: Soft and depressible, nontender with no palpable mass, no hepatomegaly.   EXTREMITIES: Well-developed well-nourished symmetrical with no dependent edema.  NEUROLOGICAL: Awake alert oriented, facial expression symmetrical, moving all extremities.  REVIEW OF DATA: I have reviewed the following data today: Office Visit on 04/17/2021  Component Date Value   WBC (White Blood Cell Co* 04/17/2021 6.4    RBC (Red Blood Cell Coun* 04/17/2021 4.70    Hemoglobin 04/17/2021 15.6 (H)    Hematocrit 04/17/2021 45.3    MCV (Mean Corpuscular Vo* 04/17/2021 96.4    MCH (Mean Corpuscular He* 04/17/2021 33.2 (H)    MCHC (Mean Corpuscular H* 04/17/2021 34.4    Platelet Count 04/17/2021 282    RDW-CV (Red Cell Distrib* 04/17/2021 12.7    MPV (Mean Platelet Volum* 04/17/2021  10.9    Neutrophils 04/17/2021 3.50    Lymphocytes 04/17/2021 2.10    Mixed Count 04/17/2021 0.80    Neutrophil % 04/17/2021 54.8    Lymphocyte % 04/17/2021 32.5    Mixed % 04/17/2021 12.7    Glucose 04/17/2021 111 (H)    Sodium 04/17/2021 137    Potassium 04/17/2021 4.2    Chloride 04/17/2021 101    Carbon Dioxide (CO2) 04/17/2021 27.5    Urea Nitrogen (BUN) 04/17/2021 20    Creatinine 04/17/2021 0.7    Glomerular Filtration Ra* 04/17/2021 82    Calcium 04/17/2021 9.2    AST  04/17/2021 23    ALT  04/17/2021 20    Alk Phos (alkaline Phosp* 04/17/2021 112 (H)    Albumin 04/17/2021 4.1    Bilirubin, Total 04/17/2021 0.5    Protein, Total 04/17/2021 6.6    A/G Ratio 04/17/2021 1.6    Cholesterol, Total 04/17/2021 244 (H)    Triglyceride 04/17/2021 144    HDL (High Density Lipopr* 04/17/2021 80.4    LDL Calculated 04/17/2021 135 (H)    VLDL Cholesterol 04/17/2021 29    Cholesterol/HDL Ratio 04/17/2021 3.0    Thyroid Stimulating Horm* 04/17/2021 2.721    Creatinine, Random Urine 04/17/2021 35.4 (L)    Urine Albumin, Random 04/17/2021 <7    Urine Albumin/Creatinine* 04/17/2021 <19.8      ASSESSMENT: Ms. Robin is a 70 y.o. female presenting for consultation for cholelithiasis.    Patient was oriented about the diagnosis of cholelithiasis. Also oriented about what is the gallbladder, its anatomy and function and the implications of having stones. The patient was oriented about the treatment alternatives (observation vs cholecystectomy). Patient was oriented that a low percentage of patient will continue to have similar pain symptoms even after the gallbladder is removed. Surgical technique (open vs laparoscopic) was discussed. It was also discussed the goals of the surgery (decrease the pain episodes and avoid the risk of cholecystitis) and the risk of surgery including: bleeding, infection, common bile duct injury, stone retention, injury to other organs such as bowel, liver,  stomach, other   complications such as hernia, bowel obstruction among others. Also discussed with patient about anesthesia and its complications such as: reaction to medications, pneumonia, heart complications, death, among others.   Cholelithiasis without cholecystitis [K80.20]  PLAN: 1.  Robotic assisted laparoscopic cholecystectomy (47562) 2.  CBC, CMP done (04/17/21) 3.  Do not take aspirin 5 days before the procedure 4.  Contact us if has any question or concern.   Patient verbalized understanding, all questions were answered, and were agreeable with the plan outlined above.     Mykel Mohl Cintron-Diaz, MD  Electronically signed by Leticia Mcdiarmid Cintron-Diaz, MD  

## 2021-05-26 ENCOUNTER — Encounter
Admission: RE | Admit: 2021-05-26 | Discharge: 2021-05-26 | Disposition: A | Discharge status: Home / Self Care | Payer: Medicare PPO | Source: Ambulatory Visit | Attending: General Surgery | Admitting: General Surgery

## 2021-05-26 ENCOUNTER — Other Ambulatory Visit: Payer: Self-pay

## 2021-05-26 VITALS — Ht 62.5 in | Wt 180.0 lb

## 2021-05-26 DIAGNOSIS — T502X5A Adverse effect of carbonic-anhydrase inhibitors, benzothiadiazides and other diuretics, initial encounter: Secondary | ICD-10-CM

## 2021-05-26 DIAGNOSIS — K760 Fatty (change of) liver, not elsewhere classified: Secondary | ICD-10-CM

## 2021-05-26 HISTORY — DX: Inflammatory liver disease, unspecified: K75.9

## 2021-05-26 HISTORY — DX: Unspecified asthma, uncomplicated: J45.909

## 2021-05-26 HISTORY — DX: Headache, unspecified: R51.9

## 2021-05-26 HISTORY — DX: Fatty (change of) liver, not elsewhere classified: K76.0

## 2021-05-26 NOTE — Patient Instructions (Signed)
Your procedure is scheduled on: Wed. 06/03/21 Report to Day Surgery.  Stop by registation desk first To find out your arrival time please call [phone] between 1PM - 3PM on  Tues 1/3.  Remember: Instructions that are not followed completely may result in serious medical risk,  up to and including death, or upon the discretion of your surgeon and anesthesiologist your  surgery may need to be rescheduled.     _X__ 1. Do not eat food after midnight the night before your procedure.                 No chewing gum or hard candies. You may drink clear liquids up to 2 hours                 before you are scheduled to arrive for your surgery- DO not drink clear                 liquids within 2 hours of the start of your surgery.                 Clear Liquids include:  water, apple juice without pulp, clear Gatorade, G2 or                  Gatorade Zero (avoid Red/Purple/Blue), Black Coffee or Tea (Do not add                 anything to coffee or tea). __X__2.  On the morning of surgery brush your teeth with toothpaste and water, you                may rinse your mouth with mouthwash if you wish.  Do not swallow any toothpaste of mouthwash.     _X__ 3.  No Alcohol for 24 hours before or after surgery.   ___ 4.  Do Not Smoke or use e-cigarettes For 24 Hours Prior to Your Surgery.                 Do not use any chewable tobacco products for at least 6 hours prior to                 Surgery.  ___  5.  Do not use any recreational drugs (marijuana, cocaine, heroin, ecstasy, MDMA or other) For at least one week prior to your surgery.   Combination of these drugs with anesthesia may have life threatening results.  ____  6.  Bring all medications with you on the day of surgery if instructed.   __x__  7.  Notify your doctor if there is any change in your medical condition      (cold, fever, infections).     Do not wear jewelry, make-up, hairpins, clips or nail polish. Do not  wear lotions, powders, or perfumes.  Do not shave 48 hours prior to surgery.  Do not bring valuables to the hospital.    [facility] is not responsible for any belongings or valuables.  Contacts, dentures or bridgework may not be worn into surgery. Leave your suitcase in the car. After surgery it may be brought to your room. For patients admitted to the hospital, discharge time is determined by your treatment team.   Patients discharged the day of surgery will not be allowed to drive home.   Make arrangements for someone to be with you for the first 24 hours of your Same Day Discharge.    Please read over the following fact  sheets that you were given:       __x__ Take these medicines the morning of surgery with A SIP OF WATER:    1.acetaminophen (TYLENOL) 500 MG tablet if needed  2. fluticasone (FLONASE) 50 MCG/ACT nasal spray if needed  3. omeprazole (PRILOSEC) 40 MG capsule  4.buPROPion (WELLBUTRIN SR) 150 MG 12 hr tablet  5.estradiol (ESTRACE) 1 MG tablet  6.gabapentin (NEURONTIN) 100 MG capsule             7.loratadine (CLARITIN) 10 MG tablet             8.montelukast (SINGULAIR) 10 MG tablet ____ Fleet Enema (as directed)   __x__ Use CHG Soap (or wipes) as directed  ____ Use Benzoyl Peroxide Gel as instructed  __x__ Use inhalers on the day of surgery albuterol (VENTOLIN HFA) 108 (90 Base) MCG/ACT inhaler and bring with you             FLOVENT HFA 110 MCG/ACT inhaler ____ Stop metformin 2 days prior to surgery    ____ Take 1/2 of usual insulin dose the night before surgery. No insulin the morning          of surgery.   ____ Stop Coumadin/Plavix/aspirin on   __x__ Stop Anti-inflammatories on ibuprofen (ADVIL) 200 MG tablet 12/28   __x__ Stop supplements until after surgery.  Omega-3 Fatty Acids (FISH OIL) 1000 MG CAPS 12/28  ____ Bring C-Pap to the hospital.    If you have any questions regarding your pre-procedure instructions,  Please call Pre-admit Testing  at [phone]

## 2021-06-02 ENCOUNTER — Other Ambulatory Visit: Payer: Self-pay

## 2021-06-02 ENCOUNTER — Encounter
Admission: RE | Admit: 2021-06-02 | Discharge: 2021-06-02 | Disposition: A | Discharge status: Home / Self Care | Payer: Medicare PPO | Source: Ambulatory Visit | Attending: General Surgery | Admitting: General Surgery

## 2021-06-02 DIAGNOSIS — Z01818 Encounter for other preprocedural examination: Secondary | ICD-10-CM | POA: Diagnosis not present

## 2021-06-02 DIAGNOSIS — T502X5A Adverse effect of carbonic-anhydrase inhibitors, benzothiadiazides and other diuretics, initial encounter: Secondary | ICD-10-CM | POA: Diagnosis not present

## 2021-06-02 LAB — POTASSIUM: Potassium: 4 mmol/L (ref 3.5–5.1)

## 2021-06-03 ENCOUNTER — Ambulatory Visit: Payer: Medicare PPO | Admitting: Urgent Care

## 2021-06-03 ENCOUNTER — Other Ambulatory Visit: Payer: Self-pay

## 2021-06-03 ENCOUNTER — Encounter
Admission: RE | Disposition: A | Discharge status: Home / Self Care | Payer: Self-pay | Source: Home / Self Care | Attending: General Surgery

## 2021-06-03 ENCOUNTER — Ambulatory Visit
Admission: RE | Admit: 2021-06-03 | Discharge: 2021-06-03 | Disposition: A | Discharge status: Home / Self Care | Payer: Medicare PPO | Attending: General Surgery | Admitting: General Surgery

## 2021-06-03 ENCOUNTER — Ambulatory Visit: Payer: Medicare PPO | Admitting: Anesthesiology

## 2021-06-03 DIAGNOSIS — K759 Inflammatory liver disease, unspecified: Secondary | ICD-10-CM | POA: Insufficient documentation

## 2021-06-03 DIAGNOSIS — I10 Essential (primary) hypertension: Secondary | ICD-10-CM | POA: Diagnosis not present

## 2021-06-03 DIAGNOSIS — R519 Headache, unspecified: Secondary | ICD-10-CM | POA: Insufficient documentation

## 2021-06-03 DIAGNOSIS — K219 Gastro-esophageal reflux disease without esophagitis: Secondary | ICD-10-CM | POA: Insufficient documentation

## 2021-06-03 DIAGNOSIS — M797 Fibromyalgia: Secondary | ICD-10-CM | POA: Insufficient documentation

## 2021-06-03 DIAGNOSIS — J45909 Unspecified asthma, uncomplicated: Secondary | ICD-10-CM | POA: Insufficient documentation

## 2021-06-03 DIAGNOSIS — R1011 Right upper quadrant pain: Secondary | ICD-10-CM | POA: Diagnosis present

## 2021-06-03 DIAGNOSIS — K8010 Calculus of gallbladder with chronic cholecystitis without obstruction: Secondary | ICD-10-CM | POA: Insufficient documentation

## 2021-06-03 DIAGNOSIS — Z6832 Body mass index (BMI) 32.0-32.9, adult: Secondary | ICD-10-CM | POA: Insufficient documentation

## 2021-06-03 DIAGNOSIS — Z87891 Personal history of nicotine dependence: Secondary | ICD-10-CM | POA: Diagnosis not present

## 2021-06-03 SURGERY — CHOLECYSTECTOMY, ROBOT-ASSISTED, LAPAROSCOPIC
Anesthesia: General | Site: Abdomen

## 2021-06-03 MED ORDER — PHENYLEPHRINE HCL (PRESSORS) 10 MG/ML IV SOLN
INTRAVENOUS | Status: DC | PRN
  Administered 2021-06-03 (×2): 80 ug via INTRAVENOUS

## 2021-06-03 MED ORDER — OXYCODONE HCL 5 MG PO TABS
5.0000 mg | ORAL_TABLET | Freq: Once | ORAL | Status: AC
  Administered 2021-06-03: 5 mg via ORAL

## 2021-06-03 MED ORDER — FENTANYL CITRATE (PF) 100 MCG/2ML IJ SOLN
INTRAMUSCULAR | Status: AC
  Filled 2021-06-03: qty 2

## 2021-06-03 MED ORDER — APREPITANT 40 MG PO CAPS: 40.0000 mg | ORAL_CAPSULE | Freq: Once | ORAL | Status: AC

## 2021-06-03 MED ORDER — ONDANSETRON HCL 4 MG/2ML IJ SOLN
INTRAMUSCULAR | Status: AC
  Filled 2021-06-03: qty 2

## 2021-06-03 MED ORDER — ORAL CARE MOUTH RINSE: 15.0000 mL | Freq: Once | OROMUCOSAL | Status: AC

## 2021-06-03 MED ORDER — LIDOCAINE HCL (CARDIAC) PF 100 MG/5ML IV SOSY
PREFILLED_SYRINGE | INTRAVENOUS | Status: DC | PRN
  Administered 2021-06-03: 80 mg via INTRAVENOUS

## 2021-06-03 MED ORDER — ONDANSETRON HCL 4 MG PO TABS: 4.0000 mg | ORAL_TABLET | Freq: Every day | ORAL | 0 refills | Status: AC | PRN

## 2021-06-03 MED ORDER — LORAZEPAM 2 MG/ML IJ SOLN
1.0000 mg | Freq: Once | INTRAMUSCULAR | Status: AC | PRN
  Administered 2021-06-03: 1 mg via INTRAVENOUS

## 2021-06-03 MED ORDER — EPHEDRINE 5 MG/ML INJ
INTRAVENOUS | Status: AC
  Filled 2021-06-03: qty 5

## 2021-06-03 MED ORDER — FENTANYL CITRATE (PF) 100 MCG/2ML IJ SOLN
25.0000 ug | INTRAMUSCULAR | Status: DC | PRN
  Administered 2021-06-03: 50 ug via INTRAVENOUS

## 2021-06-03 MED ORDER — EPHEDRINE SULFATE 50 MG/ML IJ SOLN
INTRAMUSCULAR | Status: DC | PRN
  Administered 2021-06-03: 5 mg via INTRAVENOUS

## 2021-06-03 MED ORDER — APREPITANT 40 MG PO CAPS
ORAL_CAPSULE | ORAL | Status: AC
  Administered 2021-06-03: 40 mg via ORAL
  Filled 2021-06-03: qty 1

## 2021-06-03 MED ORDER — ONDANSETRON HCL 4 MG/2ML IJ SOLN
INTRAMUSCULAR | Status: DC | PRN
  Administered 2021-06-03: 4 mg via INTRAVENOUS

## 2021-06-03 MED ORDER — FENTANYL CITRATE (PF) 100 MCG/2ML IJ SOLN
INTRAMUSCULAR | Status: DC | PRN
  Administered 2021-06-03 (×2): 50 ug via INTRAVENOUS

## 2021-06-03 MED ORDER — 0.9 % SODIUM CHLORIDE (POUR BTL) OPTIME
TOPICAL | Status: DC | PRN
  Administered 2021-06-03: 500 mL

## 2021-06-03 MED ORDER — CELECOXIB 200 MG PO CAPS
ORAL_CAPSULE | ORAL | Status: AC
  Administered 2021-06-03: 200 mg via ORAL
  Filled 2021-06-03: qty 1

## 2021-06-03 MED ORDER — ROCURONIUM BROMIDE 100 MG/10ML IV SOLN
INTRAVENOUS | Status: DC | PRN
  Administered 2021-06-03: 50 mg via INTRAVENOUS
  Administered 2021-06-03: 20 mg via INTRAVENOUS

## 2021-06-03 MED ORDER — BUPIVACAINE-EPINEPHRINE (PF) 0.25% -1:200000 IJ SOLN
INTRAMUSCULAR | Status: AC
  Filled 2021-06-03: qty 30

## 2021-06-03 MED ORDER — SUGAMMADEX SODIUM 500 MG/5ML IV SOLN
INTRAVENOUS | Status: AC
  Filled 2021-06-03: qty 5

## 2021-06-03 MED ORDER — CELECOXIB 200 MG PO CAPS: 200.0000 mg | ORAL_CAPSULE | Freq: Once | ORAL | Status: AC

## 2021-06-03 MED ORDER — OXYCODONE HCL 5 MG PO TABS
ORAL_TABLET | ORAL | Status: AC
  Filled 2021-06-03: qty 1

## 2021-06-03 MED ORDER — GABAPENTIN 300 MG PO CAPS
ORAL_CAPSULE | ORAL | Status: AC
  Filled 2021-06-03: qty 1

## 2021-06-03 MED ORDER — MIDAZOLAM HCL 2 MG/2ML IJ SOLN
INTRAMUSCULAR | Status: AC
  Filled 2021-06-03: qty 2

## 2021-06-03 MED ORDER — HYDROCODONE-ACETAMINOPHEN 5-325 MG PO TABS: 1.0000 | ORAL_TABLET | ORAL | 0 refills | Status: AC | PRN

## 2021-06-03 MED ORDER — CEFAZOLIN SODIUM-DEXTROSE 2-4 GM/100ML-% IV SOLN
INTRAVENOUS | Status: AC
  Filled 2021-06-03: qty 100

## 2021-06-03 MED ORDER — SUGAMMADEX SODIUM 500 MG/5ML IV SOLN
INTRAVENOUS | Status: DC | PRN
  Administered 2021-06-03: 326.8 mg via INTRAVENOUS

## 2021-06-03 MED ORDER — LACTATED RINGERS IV SOLN: INTRAVENOUS | Status: DC

## 2021-06-03 MED ORDER — ONDANSETRON HCL 4 MG/2ML IJ SOLN: 4.0000 mg | Freq: Once | INTRAMUSCULAR | Status: DC | PRN

## 2021-06-03 MED ORDER — CHLORHEXIDINE GLUCONATE 0.12 % MT SOLN
15.0000 mL | Freq: Once | OROMUCOSAL | Status: AC
  Administered 2021-06-03: 15 mL via OROMUCOSAL

## 2021-06-03 MED ORDER — PROPOFOL 10 MG/ML IV BOLUS
INTRAVENOUS | Status: AC
  Filled 2021-06-03: qty 20

## 2021-06-03 MED ORDER — BUPIVACAINE-EPINEPHRINE 0.25% -1:200000 IJ SOLN
INTRAMUSCULAR | Status: DC | PRN
  Administered 2021-06-03: 30 mL

## 2021-06-03 MED ORDER — GABAPENTIN 600 MG PO TABS
300.0000 mg | ORAL_TABLET | Freq: Once | ORAL | Status: AC
  Administered 2021-06-03: 300 mg via ORAL
  Filled 2021-06-03: qty 0.5

## 2021-06-03 MED ORDER — GLYCOPYRROLATE 0.2 MG/ML IJ SOLN
INTRAMUSCULAR | Status: DC | PRN
  Administered 2021-06-03: .2 mg via INTRAVENOUS

## 2021-06-03 MED ORDER — LORAZEPAM 2 MG/ML IJ SOLN
INTRAMUSCULAR | Status: AC
  Filled 2021-06-03: qty 1

## 2021-06-03 MED ORDER — CEFAZOLIN SODIUM-DEXTROSE 2-4 GM/100ML-% IV SOLN
2.0000 g | INTRAVENOUS | Status: AC
  Administered 2021-06-03: 2 g via INTRAVENOUS

## 2021-06-03 MED ORDER — DEXAMETHASONE SODIUM PHOSPHATE 10 MG/ML IJ SOLN
INTRAMUSCULAR | Status: AC
  Filled 2021-06-03: qty 1

## 2021-06-03 MED ORDER — DEXAMETHASONE SODIUM PHOSPHATE 10 MG/ML IJ SOLN
INTRAMUSCULAR | Status: DC | PRN
  Administered 2021-06-03: 10 mg via INTRAVENOUS

## 2021-06-03 MED ORDER — GLYCOPYRROLATE 0.2 MG/ML IJ SOLN
INTRAMUSCULAR | Status: AC
  Filled 2021-06-03: qty 1

## 2021-06-03 MED ORDER — ACETAMINOPHEN 500 MG PO TABS
ORAL_TABLET | ORAL | Status: AC
  Administered 2021-06-03: 1000 mg via ORAL
  Filled 2021-06-03: qty 2

## 2021-06-03 MED ORDER — PROPOFOL 10 MG/ML IV BOLUS
INTRAVENOUS | Status: DC | PRN
  Administered 2021-06-03: 140 mg via INTRAVENOUS

## 2021-06-03 MED ORDER — INDOCYANINE GREEN 25 MG IV SOLR
1.2500 mg | Freq: Once | INTRAVENOUS | Status: AC
  Administered 2021-06-03: 1.25 mg via INTRAVENOUS
  Filled 2021-06-03: qty 0.5

## 2021-06-03 MED ORDER — ACETAMINOPHEN 500 MG PO TABS: 1000.0000 mg | ORAL_TABLET | Freq: Once | ORAL | Status: AC

## 2021-06-03 MED ORDER — MEPERIDINE HCL 25 MG/ML IJ SOLN: 6.2500 mg | INTRAMUSCULAR | Status: DC | PRN

## 2021-06-03 SURGICAL SUPPLY — 46 items
ADH SKN CLS APL DERMABOND .7 (GAUZE/BANDAGES/DRESSINGS) ×1
BAG INFUSER PRESSURE 100CC (MISCELLANEOUS) IMPLANT
BAG SPEC RTRVL LRG 6X4 10 (ENDOMECHANICALS) ×1
BLADE SURG SZ11 CARB STEEL (BLADE) ×2 IMPLANT
CANNULA REDUC XI 12-8 STAPL (CANNULA) ×1
CANNULA REDUCER 12-8 DVNC XI (CANNULA) ×1 IMPLANT
CLIP LIGATING HEMO O LOK GREEN (MISCELLANEOUS) ×2 IMPLANT
DECANTER SPIKE VIAL GLASS SM (MISCELLANEOUS) ×4 IMPLANT
DERMABOND ADVANCED (GAUZE/BANDAGES/DRESSINGS) ×1
DERMABOND ADVANCED .7 DNX12 (GAUZE/BANDAGES/DRESSINGS) ×1 IMPLANT
DRAPE ARM DVNC X/XI (DISPOSABLE) ×4 IMPLANT
DRAPE COLUMN DVNC XI (DISPOSABLE) ×1 IMPLANT
DRAPE DA VINCI XI ARM (DISPOSABLE) ×4
DRAPE DA VINCI XI COLUMN (DISPOSABLE) ×1
ELECT REM PT RETURN 9FT ADLT (ELECTROSURGICAL) ×2
ELECTRODE REM PT RTRN 9FT ADLT (ELECTROSURGICAL) ×1 IMPLANT
GAUZE 4X4 16PLY ~~LOC~~+RFID DBL (SPONGE) ×2 IMPLANT
GLOVE SURG ENC MOIS LTX SZ6.5 (GLOVE) ×4 IMPLANT
GLOVE SURG UNDER POLY LF SZ6.5 (GLOVE) ×4 IMPLANT
GOWN STRL REUS W/ TWL LRG LVL3 (GOWN DISPOSABLE) ×3 IMPLANT
GOWN STRL REUS W/TWL LRG LVL3 (GOWN DISPOSABLE) ×6
GRASPER SUT TROCAR 14GX15 (MISCELLANEOUS) ×2 IMPLANT
IRRIGATOR SUCT 8 DISP DVNC XI (IRRIGATION / IRRIGATOR) IMPLANT
IRRIGATOR SUCTION 8MM XI DISP (IRRIGATION / IRRIGATOR)
KIT PINK PAD W/HEAD ARE REST (MISCELLANEOUS) ×2
KIT PINK PAD W/HEAD ARM REST (MISCELLANEOUS) ×1 IMPLANT
LABEL OR SOLS (LABEL) ×2 IMPLANT
NDL INSUFFLATION 14GA 120MM (NEEDLE) ×1 IMPLANT
NEEDLE HYPO 22GX1.5 SAFETY (NEEDLE) ×2 IMPLANT
NEEDLE INSUFFLATION 14GA 120MM (NEEDLE) ×2 IMPLANT
NS IRRIG 500ML POUR BTL (IV SOLUTION) ×2 IMPLANT
OBTURATOR OPTICAL STANDARD 8MM (TROCAR) ×1
OBTURATOR OPTICAL STND 8 DVNC (TROCAR) ×1
OBTURATOR OPTICALSTD 8 DVNC (TROCAR) ×1 IMPLANT
PACK LAP CHOLECYSTECTOMY (MISCELLANEOUS) ×2 IMPLANT
POUCH SPECIMEN RETRIEVAL 10MM (ENDOMECHANICALS) ×2 IMPLANT
SEAL CANN UNIV 5-8 DVNC XI (MISCELLANEOUS) ×3 IMPLANT
SEAL XI 5MM-8MM UNIVERSAL (MISCELLANEOUS) ×3
SET TUBE SMOKE EVAC HIGH FLOW (TUBING) ×2 IMPLANT
SOLUTION ELECTROLUBE (MISCELLANEOUS) ×2 IMPLANT
STAPLER CANNULA SEAL DVNC XI (STAPLE) ×1 IMPLANT
STAPLER CANNULA SEAL XI (STAPLE) ×1
SUT MNCRL 4-0 (SUTURE) ×2
SUT MNCRL 4-0 27XMFL (SUTURE) ×1
SUT VICRYL 0 AB UR-6 (SUTURE) ×2 IMPLANT
SUTURE MNCRL 4-0 27XMF (SUTURE) ×1 IMPLANT

## 2021-06-03 NOTE — Op Note (Signed)
Preoperative diagnosis: Cholelithiasis  Postoperative diagnosis: Same  Procedure: Robotic Assisted Laparoscopic Cholecystectomy.   Anesthesia: GETA   Surgeon: Dr. Hazle Quant  Wound Classification: Clean Contaminated  Indications: Patient is a 71 y.o. female developed right upper quadrant pain and on workup was found to have cholelithiasis with a normal common duct. Robotic Assisted Laparoscopic cholecystectomy was elected.  Findings:  Critical view of safety achieved Cystic duct and artery identified, ligated and divided Adequate hemostasis       Description of procedure: The patient was placed on the operating table in the supine position. General anesthesia was induced. A time-out was completed verifying correct patient, procedure, site, positioning, and implant(s) and/or special equipment prior to beginning this procedure. An orogastric tube was placed. The abdomen was prepped and draped in the usual sterile fashion.  An incision was made in a natural skin line below the umbilicus.  The fascia was elevated and the Veress needle inserted. Proper position was confirmed by aspiration and saline meniscus test.  The abdomen was insufflated with carbon dioxide to a pressure of 15 mmHg. The patient tolerated insufflation well. A 8-mm trocar was then inserted in optiview fashion.  The laparoscope was inserted and the abdomen inspected. No injuries from initial trocar placement were noted. Additional trocars were then inserted in the following locations: an 8-mm trocar in the left lateral abdomen, and another two 8-mm trocars to the right side of the abdomen 5 cm appart. The umbilical trocar was changed to a 12 mm trocar all under direct visualization. The abdomen was inspected and no abnormalities were found. The table was placed in the reverse Trendelenburg position with the right side up. The robotic arms were docked and target anatomy identified. Instrument inserted under direct  visualization.  Filmy adhesions between the gallbladder and omentum, duodenum and transverse colon were lysed with electrocautery. The dome of the gallbladder was grasped with a prograsp and retracted over the dome of the liver. The infundibulum was also grasped with an atraumatic grasper and retracted toward the right lower quadrant. This maneuver exposed Calots triangle. The peritoneum overlying the gallbladder infundibulum was then incised and the cystic duct and cystic artery identified and circumferentially dissected. Critical view of safety reviewed before ligating any structure. Firefly images taken to visualize biliary ducts. The cystic duct and cystic artery were then doubly clipped and divided close to the gallbladder.  The gallbladder was then dissected from its peritoneal attachments by electrocautery. Hemostasis was checked and the gallbladder and contained stones were removed using an endoscopic retrieval bag. The gallbladder was passed off the table as a specimen. There was no evidence of bleeding from the gallbladder fossa or cystic artery or leakage of the bile from the cystic duct stump. Secondary trocars were removed under direct vision. No bleeding was noted. The robotic arms were undoked. The scope was withdrawn and the umbilical trocar removed. The abdomen was allowed to collapse. The fascia of the 61mm trocar sites was closed with figure-of-eight 0 vicryl sutures. The skin was closed with subcuticular sutures of 4-0 monocryl and topical skin adhesive. The orogastric tube was removed.  The patient tolerated the procedure well and was taken to the postanesthesia care unit in stable condition.   Specimen: Gallbladder  Complications: None  EBL: 5 mL

## 2021-06-03 NOTE — Anesthesia Preprocedure Evaluation (Addendum)
Anesthesia Evaluation  Patient identified by MRN, date of birth, ID band Patient awake    Airway Mallampati: II  TM Distance: >3 FB Neck ROM: Full    Dental  (+) Caps   Pulmonary asthma , former smoker,    Pulmonary exam normal breath sounds clear to auscultation       Cardiovascular hypertension, Normal cardiovascular exam Rhythm:Regular Rate:Normal  EKG  2023    NSR    LAD   Neuro/Psych  Headaches,    GI/Hepatic GERD  ,(+) Hepatitis -, ACHOLELITHIASIS   Endo/Other  Morbid obesity  Renal/GU      Musculoskeletal  (+) Fibromyalgia -  Abdominal   Peds  Hematology   Anesthesia Other Findings   Reproductive/Obstetrics                          Anesthesia Physical Anesthesia Plan  ASA: 3  Anesthesia Plan: General ETT   Post-op Pain Management:    Induction: Intravenous  PONV Risk Score and Plan:   Airway Management Planned: Oral ETT  Additional Equipment:   Intra-op Plan:   Post-operative Plan: Extubation in OR  Informed Consent: I have reviewed the patients History and Physical, chart, labs and discussed the procedure including the risks, benefits and alternatives for the proposed anesthesia with the patient or authorized representative who has indicated his/her understanding and acceptance.     Dental Advisory Given  Plan Discussed with: Anesthesiologist, CRNA and Surgeon  Anesthesia Plan Comments: (Patient consented for risks of anesthesia including but not limited to:  - adverse reactions to medications - damage to eyes, teeth, lips or other oral mucosa - nerve damage due to positioning  - sore throat or hoarseness - Damage to heart, brain, nerves, lungs, other parts of body or loss of life  Patient voiced understanding.)        Anesthesia Quick Evaluation

## 2021-06-03 NOTE — Interval H&P Note (Signed)
History and Physical Interval Note:  06/03/2021 2:15 PM  Madeline Powell  has presented today for surgery, with the diagnosis of K80.20 Cholelithiasis w/o cholecystitis.  The various methods of treatment have been discussed with the patient and family. After consideration of risks, benefits and other options for treatment, the patient has consented to  Procedure(s): XI ROBOTIC ASSISTED LAPAROSCOPIC CHOLECYSTECTOMY (N/A) INDOCYANINE GREEN FLUORESCENCE IMAGING (ICG) (N/A) as a surgical intervention.  The patient's history has been reviewed, patient examined, no change in status, stable for surgery.  I have reviewed the patient's chart and labs.  Questions were answered to the patient's satisfaction.     Carolan Shiver

## 2021-06-03 NOTE — Anesthesia Procedure Notes (Signed)
Procedure Name: Intubation Date/Time: 06/03/2021 2:27 PM Performed by: Elmarie Mainland, CRNA Pre-anesthesia Checklist: Patient identified, Emergency Drugs available, Suction available and Patient being monitored Patient Re-evaluated:Patient Re-evaluated prior to induction Oxygen Delivery Method: Circle system utilized Preoxygenation: Pre-oxygenation with 100% oxygen Induction Type: IV induction Ventilation: Mask ventilation without difficulty Laryngoscope Size: McGraph and 3 Grade View: Grade I Tube type: Oral Tube size: 6.5 mm Number of attempts: 1 Airway Equipment and Method: Stylet, Oral airway and Video-laryngoscopy Placement Confirmation: ETT inserted through vocal cords under direct vision, positive ETCO2 and breath sounds checked- equal and bilateral Secured at: 21 cm Tube secured with: Tape Dental Injury: Teeth and Oropharynx as per pre-operative assessment

## 2021-06-03 NOTE — Transfer of Care (Signed)
Immediate Anesthesia Transfer of Care Note  Patient: Madeline Powell  Procedure(s) Performed: XI ROBOTIC ASSISTED LAPAROSCOPIC CHOLECYSTECTOMY (Abdomen) INDOCYANINE GREEN FLUORESCENCE IMAGING (ICG) (Abdomen)  Patient Location: PACU  Anesthesia Type:General  Level of Consciousness: awake, drowsy and patient cooperative  Airway & Oxygen Therapy: Patient Spontanous Breathing and Patient connected to face mask oxygen  Post-op Assessment: Report given to RN and Post -op Vital signs reviewed and stable  Post vital signs: Reviewed and stable  Last Vitals:  Vitals Value Taken Time  BP 123/67 06/03/21 1545  Temp    Pulse 79 06/03/21 1550  Resp 16 06/03/21 1550  SpO2 100 % 06/03/21 1550  Vitals shown include unvalidated device data.  Last Pain:  Vitals:   06/03/21 1300  TempSrc: Oral         Complications: No notable events documented.

## 2021-06-03 NOTE — Discharge Instructions (Addendum)

## 2021-06-03 NOTE — Progress Notes (Signed)
Reviewed procedure, plan of care with patient and daughter, verbalize understanding.

## 2021-06-04 ENCOUNTER — Ambulatory Visit: Payer: Medicare PPO

## 2021-06-04 NOTE — Anesthesia Postprocedure Evaluation (Signed)
Anesthesia Post Note  Patient: EVGENIA LUSCOMBE  Procedure(s) Performed: XI ROBOTIC ASSISTED LAPAROSCOPIC CHOLECYSTECTOMY (Abdomen) INDOCYANINE GREEN FLUORESCENCE IMAGING (ICG) (Abdomen)  Patient location during evaluation: PACU Anesthesia Type: General Level of consciousness: awake and alert Pain management: pain level controlled Vital Signs Assessment: post-procedure vital signs reviewed and stable Respiratory status: spontaneous breathing, nonlabored ventilation, respiratory function stable and patient connected to nasal cannula oxygen Cardiovascular status: blood pressure returned to baseline and stable Postop Assessment: no apparent nausea or vomiting Anesthetic complications: no   No notable events documented.   Last Vitals:  Vitals:   06/03/21 1645 06/03/21 1700  BP:  116/77  Pulse: 87 76  Resp: 16 20  Temp: (!) 36.2 C (!) 36.2 C  SpO2: 93% 95%    Last Pain:  Vitals:   06/03/21 1700  TempSrc: Temporal  PainSc: 4                  Martha Clan

## 2021-06-05 LAB — SURGICAL PATHOLOGY

## 2021-08-07 ENCOUNTER — Encounter: Payer: Self-pay | Admitting: General Surgery

## 2021-11-26 ENCOUNTER — Other Ambulatory Visit: Payer: Self-pay | Admitting: Nurse Practitioner

## 2021-11-26 DIAGNOSIS — Z1231 Encounter for screening mammogram for malignant neoplasm of breast: Secondary | ICD-10-CM

## 2022-01-07 ENCOUNTER — Ambulatory Visit
Admission: RE | Admit: 2022-01-07 | Discharge: 2022-01-07 | Disposition: A | Discharge status: Home / Self Care | Payer: Medicare PPO | Source: Ambulatory Visit | Attending: Nurse Practitioner | Admitting: Nurse Practitioner

## 2022-01-07 DIAGNOSIS — Z1231 Encounter for screening mammogram for malignant neoplasm of breast: Secondary | ICD-10-CM | POA: Insufficient documentation

## 2022-02-24 IMAGING — MG MM DIGITAL SCREENING BILAT W/ TOMO AND CAD
8 series · 8 of 24 positions shown · non-contrast
Comparison: Previous exam(s).

ACR Breast Density Category a: The breast tissue is almost entirely
fatty.

CLINICAL DATA: Screening.

EXAM:
DIGITAL SCREENING BILATERAL MAMMOGRAM WITH TOMOSYNTHESIS AND CAD
TECHNIQUE: Bilateral screening digital craniocaudal and mediolateral oblique
mammograms were obtained. Bilateral screening digital breast
tomosynthesis was performed. The images were evaluated with
computer-aided detection.

[L MLO synth-2D]
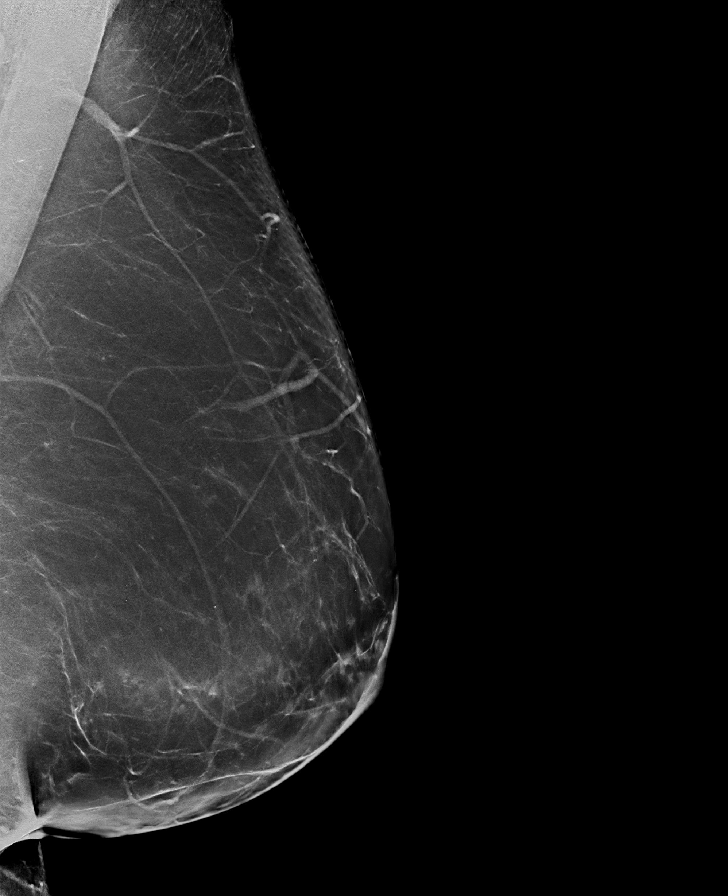

[L CC synth-2D]
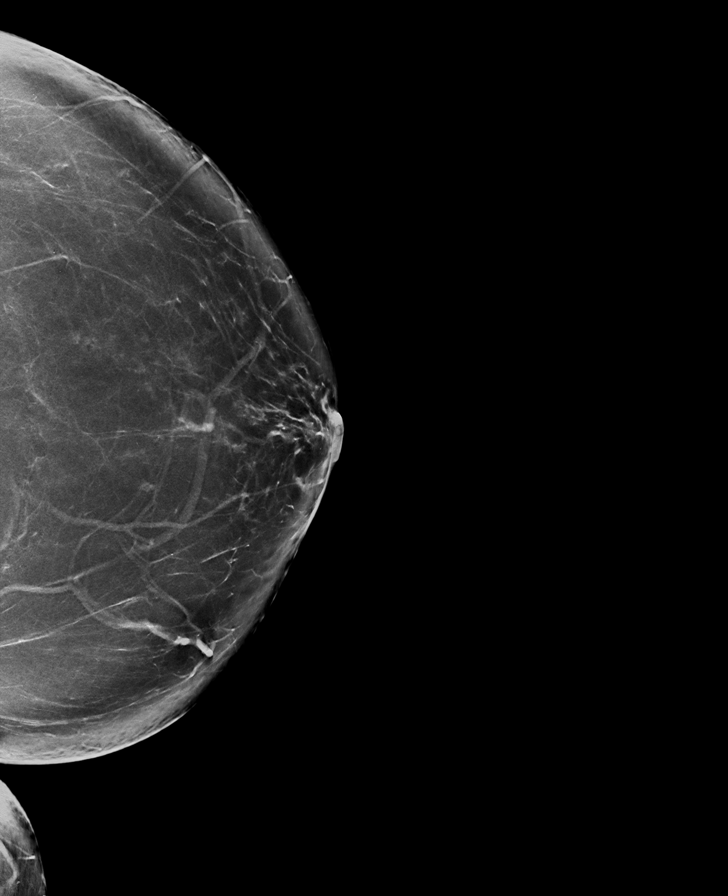

[R CC synth-2D]
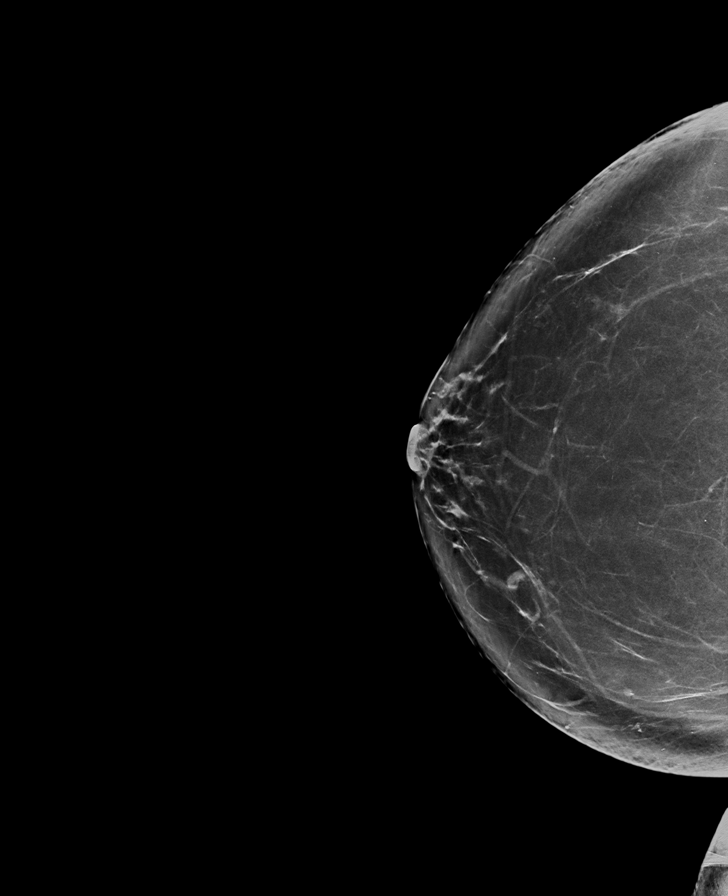

[R MLO synth-2D]
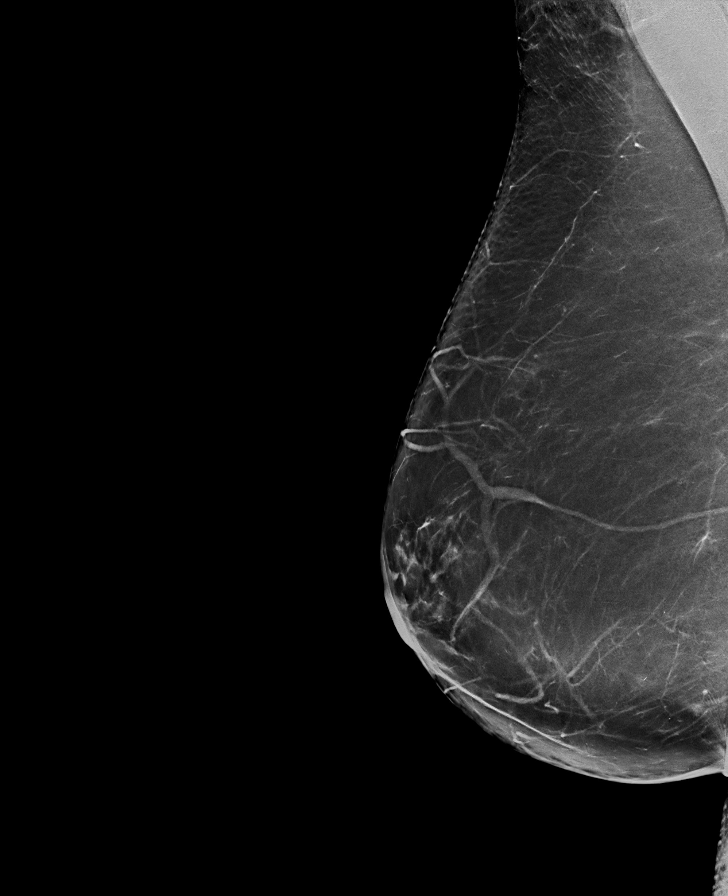

[R CC tomo · tomo slice 43/84.0]
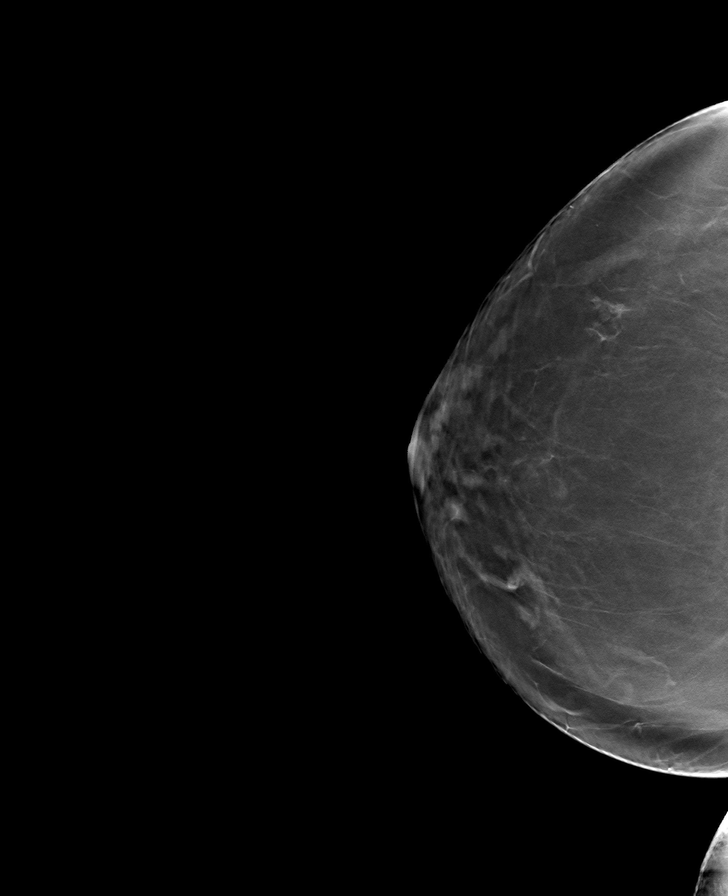

[R MLO tomo · tomo slice 45/89.0]
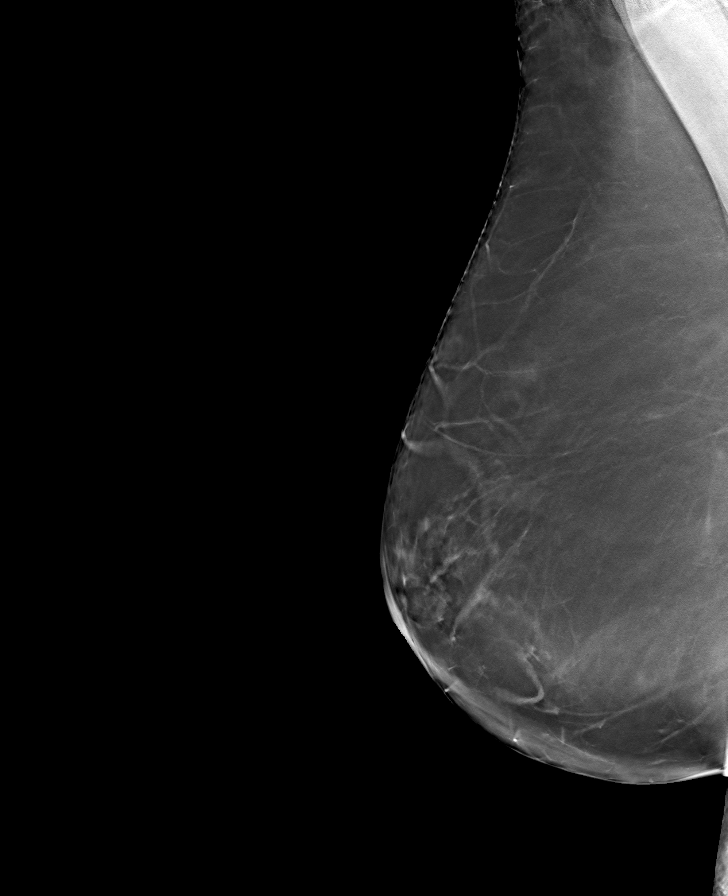

[L MLO tomo · tomo slice 47/93.0]
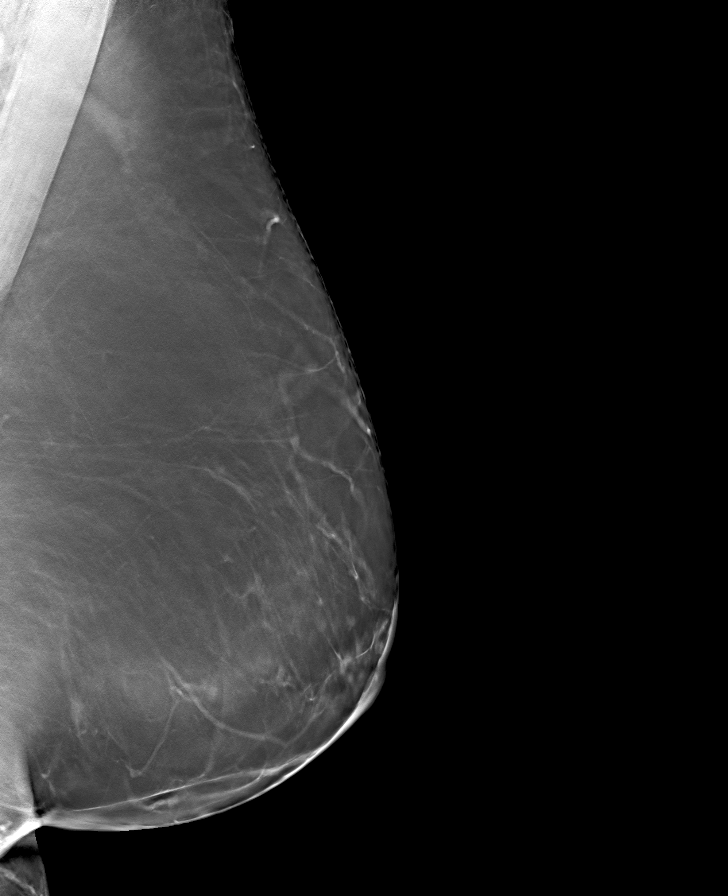

[L CC tomo · tomo slice 45/89.0]
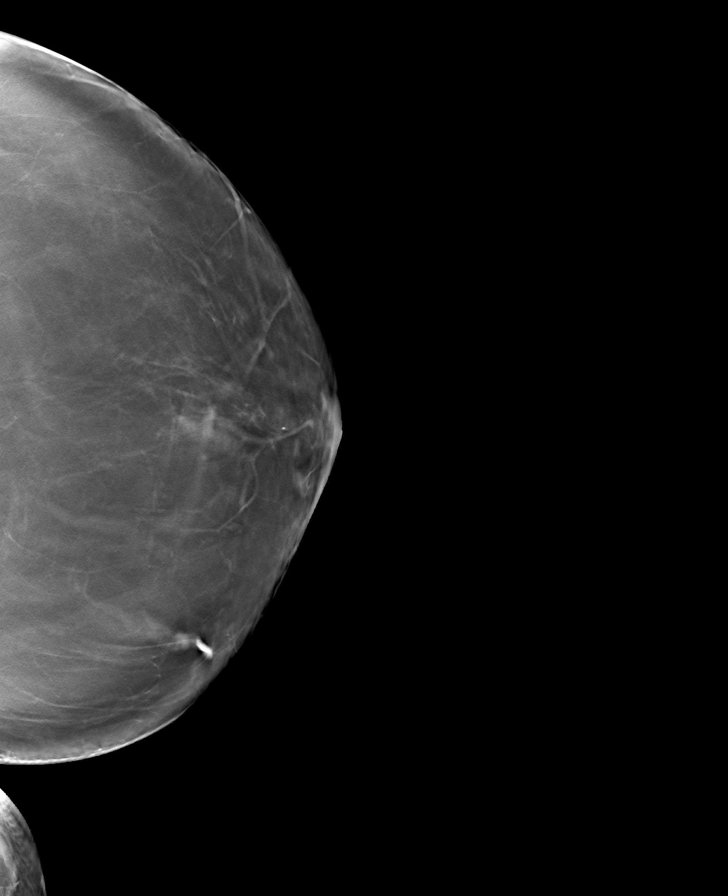

[8 of 24 positions shown; findings below may reference images not displayed]

FINDINGS: There are no findings suspicious for malignancy.
IMPRESSION: No mammographic evidence of malignancy. A result letter of this
screening mammogram will be mailed directly to the patient.

RECOMMENDATION:
Screening mammogram in one year. (Code:0E-3-N98)

BI-RADS CATEGORY  1: Negative.

## 2022-06-11 IMAGING — US US ABDOMEN LIMITED
1 series · 14 of 25 positions shown · non-contrast
Comparison: None.

CLINICAL DATA: Pain right upper quadrant

EXAM:
ULTRASOUND ABDOMEN LIMITED RIGHT UPPER QUADRANT

[Series 1: us abdomen limited · 0.20mm/px · 14 of 32 slices shown]
[im 1/32]
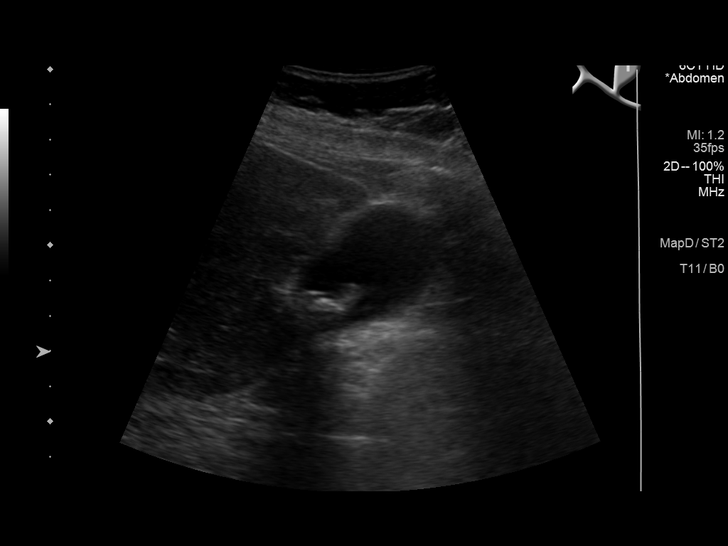
[im 3/32]
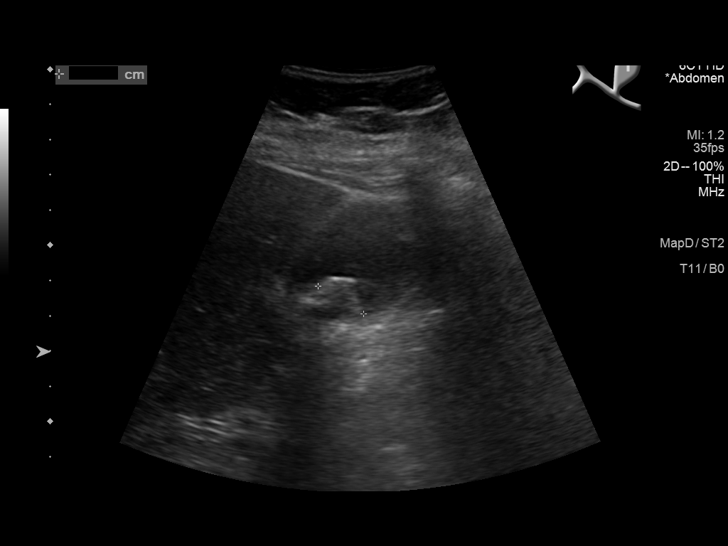
[im 6/32]
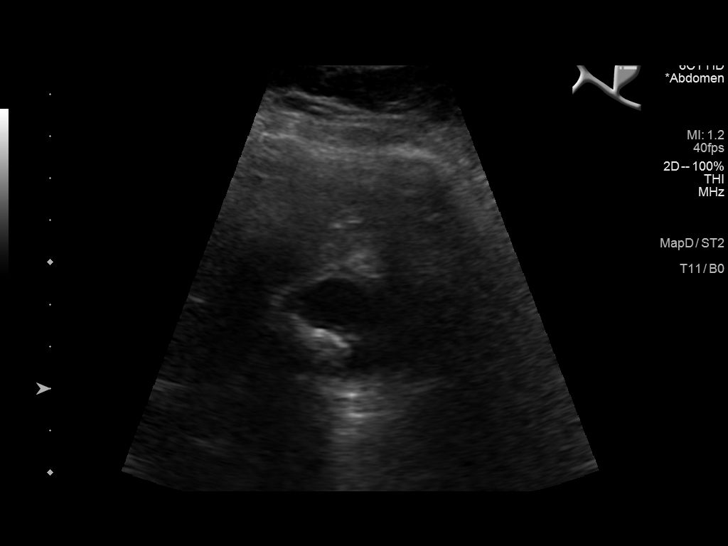
[im 8/32]
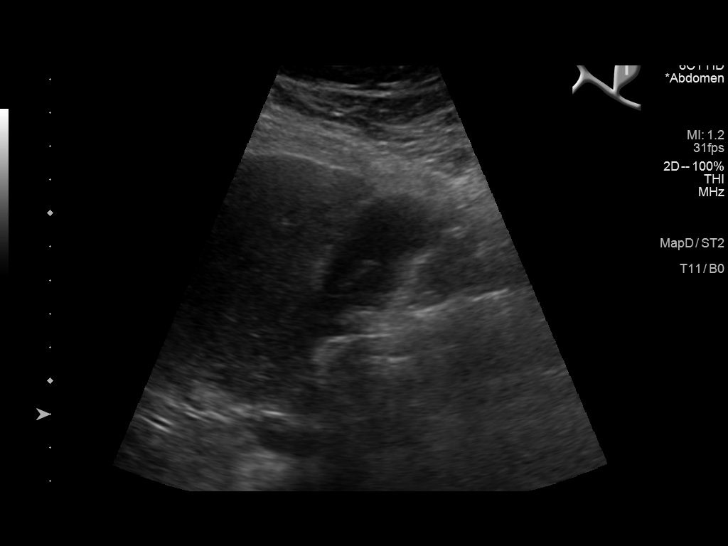
[im 11/32]
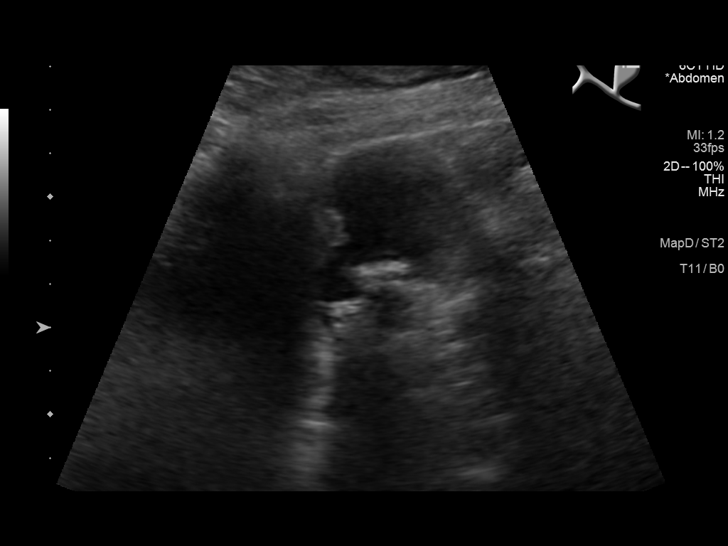
[im 12/32]
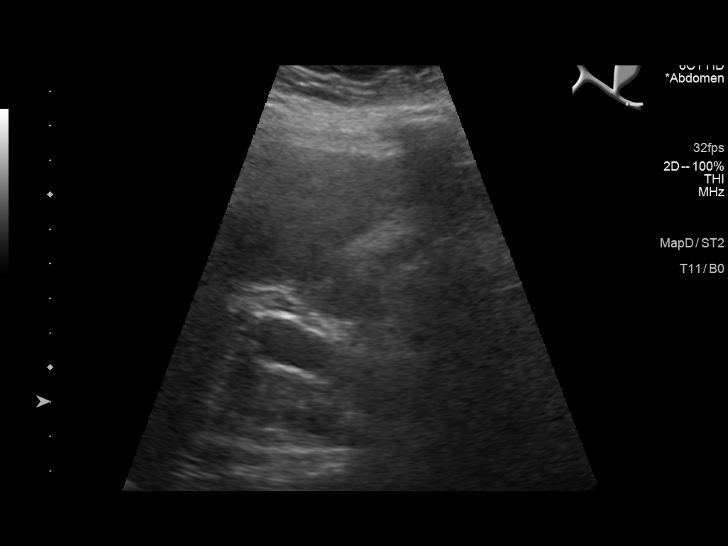
[im 15/32]
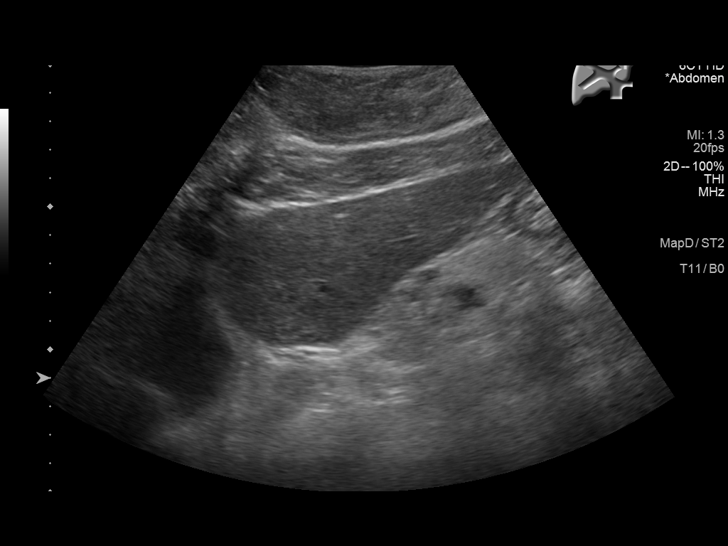
[im 17/32]
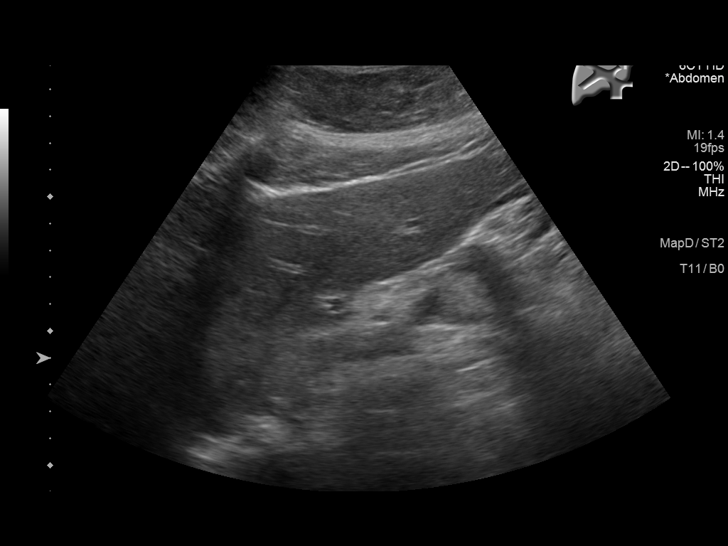
[im 20/32]
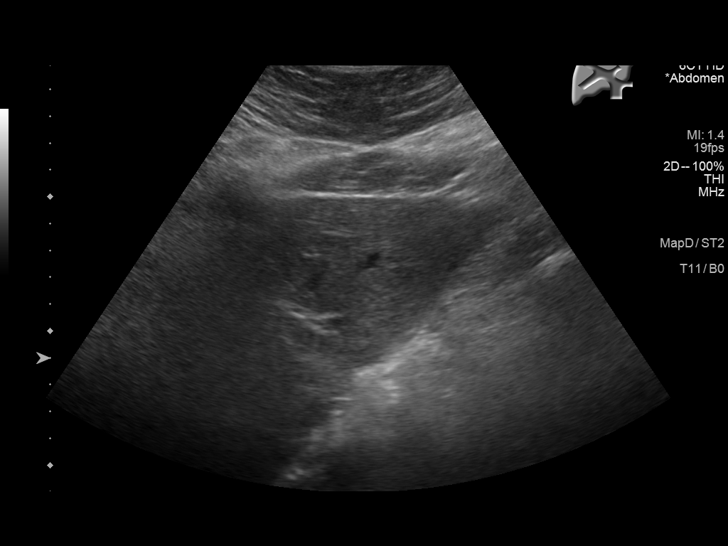
[im 21/32]
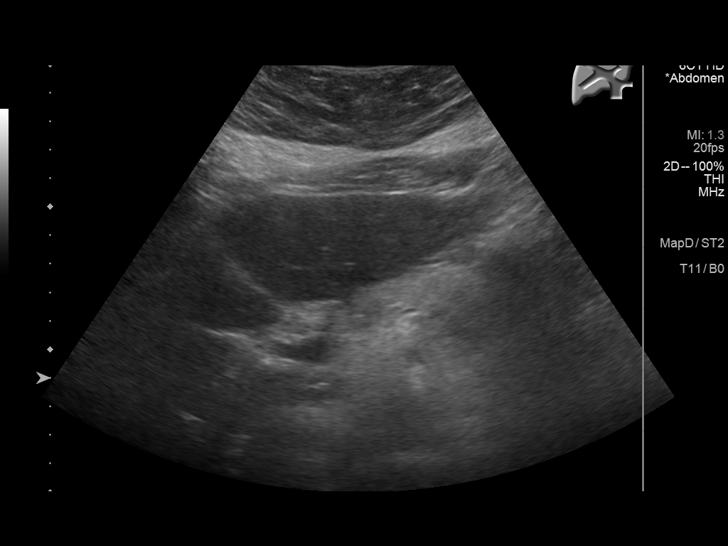
[im 24/32]
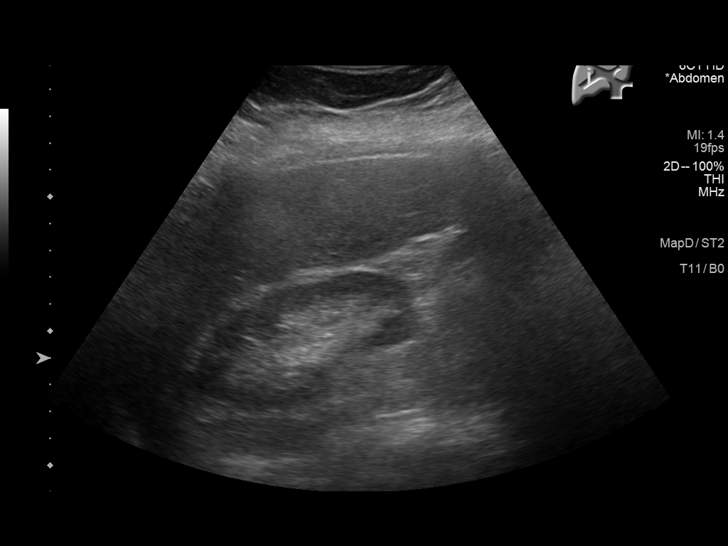
[im 26/32]
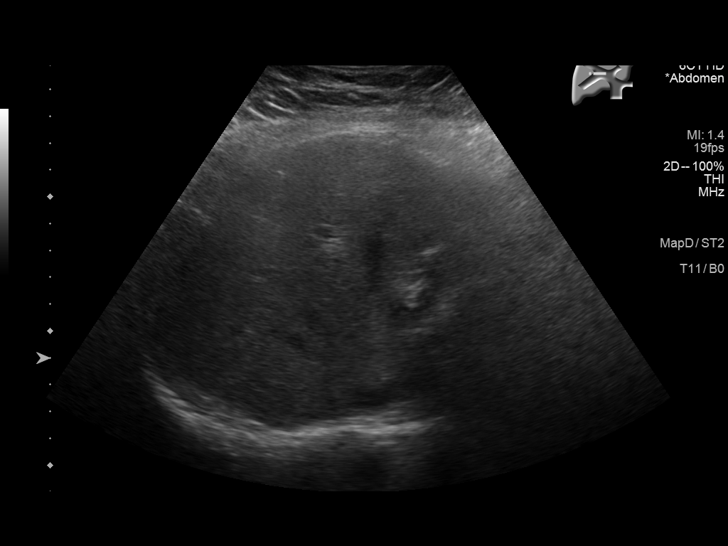
[im 29/32]
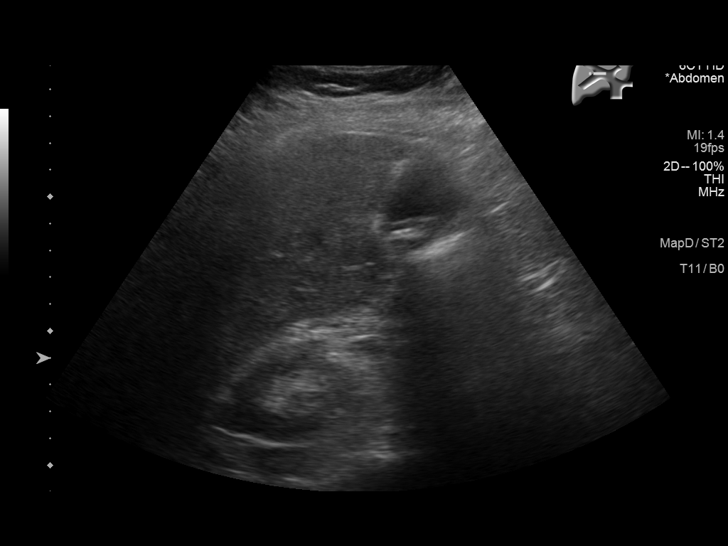
[im 32/32]
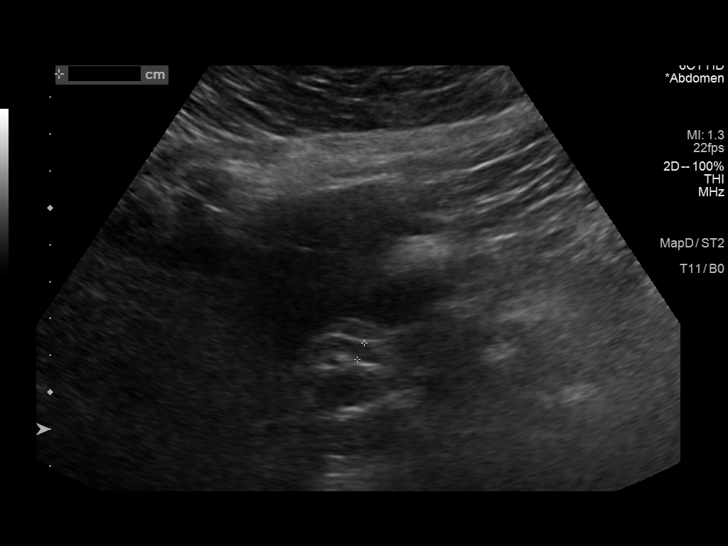

[14 of 25 positions shown; findings below may reference images not displayed]

FINDINGS: Gallbladder:

There are multiple gallbladder stones. Technologist did not observe
any tenderness over the gallbladder. There is no fluid around the
gallbladder.

Common bile duct:

Diameter: 4.9 mm

Liver:

There is increased echogenicity in the liver. No focal abnormality
is seen in the visualized portions of liver. Portal vein is patent
on color Doppler imaging with normal direction of blood flow towards
the liver.

Other: None.
IMPRESSION: Hepatic steatosis. Gallbladder stones. No other sonographic
abnormality is seen in the right upper quadrant.

## 2022-12-28 ENCOUNTER — Other Ambulatory Visit: Payer: Self-pay | Admitting: Nurse Practitioner

## 2022-12-28 DIAGNOSIS — Z1231 Encounter for screening mammogram for malignant neoplasm of breast: Secondary | ICD-10-CM

## 2023-01-19 ENCOUNTER — Ambulatory Visit
Admission: RE | Admit: 2023-01-19 | Discharge: 2023-01-19 | Disposition: A | Discharge status: Home / Self Care | Payer: Medicare PPO | Source: Ambulatory Visit | Attending: Nurse Practitioner | Admitting: Nurse Practitioner

## 2023-01-19 DIAGNOSIS — Z1231 Encounter for screening mammogram for malignant neoplasm of breast: Secondary | ICD-10-CM | POA: Diagnosis present

## 2023-01-21 ENCOUNTER — Encounter: Payer: Self-pay | Admitting: Unknown Physician Specialty

## 2023-01-25 ENCOUNTER — Other Ambulatory Visit: Payer: Self-pay | Admitting: Nurse Practitioner

## 2023-01-25 DIAGNOSIS — R928 Other abnormal and inconclusive findings on diagnostic imaging of breast: Secondary | ICD-10-CM

## 2023-01-26 ENCOUNTER — Ambulatory Visit
Admission: RE | Admit: 2023-01-26 | Discharge: 2023-01-26 | Disposition: A | Discharge status: Home / Self Care | Payer: Medicare PPO | Source: Ambulatory Visit | Attending: Nurse Practitioner | Admitting: Nurse Practitioner

## 2023-01-26 DIAGNOSIS — R928 Other abnormal and inconclusive findings on diagnostic imaging of breast: Secondary | ICD-10-CM | POA: Insufficient documentation

## 2023-12-26 ENCOUNTER — Other Ambulatory Visit: Payer: Self-pay | Admitting: Nurse Practitioner

## 2023-12-26 DIAGNOSIS — Z1231 Encounter for screening mammogram for malignant neoplasm of breast: Secondary | ICD-10-CM

## 2024-01-31 ENCOUNTER — Ambulatory Visit
Admission: RE | Admit: 2024-01-31 | Discharge: 2024-01-31 | Disposition: A | Discharge status: Home / Self Care | Source: Ambulatory Visit | Attending: Nurse Practitioner | Admitting: Nurse Practitioner

## 2024-01-31 DIAGNOSIS — Z1231 Encounter for screening mammogram for malignant neoplasm of breast: Secondary | ICD-10-CM | POA: Diagnosis present

## 2024-03-19 ENCOUNTER — Ambulatory Visit
Admission: EM | Admit: 2024-03-19 | Discharge: 2024-03-19 | Disposition: A | Discharge status: Home / Self Care | Attending: Emergency Medicine | Admitting: Emergency Medicine

## 2024-03-19 DIAGNOSIS — R8271 Bacteriuria: Secondary | ICD-10-CM | POA: Diagnosis not present

## 2024-03-19 DIAGNOSIS — R748 Abnormal levels of other serum enzymes: Secondary | ICD-10-CM

## 2024-03-19 DIAGNOSIS — J019 Acute sinusitis, unspecified: Secondary | ICD-10-CM | POA: Diagnosis not present

## 2024-03-19 DIAGNOSIS — R519 Headache, unspecified: Secondary | ICD-10-CM | POA: Diagnosis present

## 2024-03-19 LAB — COMPREHENSIVE METABOLIC PANEL WITH GFR
ALT: 99 U/L — ABNORMAL HIGH (ref 0–44)
AST: 123 U/L — ABNORMAL HIGH (ref 15–41)
Albumin: 3.9 g/dL (ref 3.5–5.0)
Alkaline Phosphatase: 132 U/L — ABNORMAL HIGH (ref 38–126)
Anion gap: 9 (ref 5–15)
BUN: 15 mg/dL (ref 8–23)
CO2: 25 mmol/L (ref 22–32)
Calcium: 9.1 mg/dL (ref 8.9–10.3)
Chloride: 101 mmol/L (ref 98–111)
Creatinine, Ser: 0.84 mg/dL (ref 0.44–1.00)
GFR, Estimated: >60 mL/min (ref >60)
Glucose, Bld: 119 mg/dL — ABNORMAL HIGH (ref 70–99)
Potassium: 4 mmol/L (ref 3.5–5.1)
Sodium: 135 mmol/L (ref 135–145)
Total Bilirubin: 1 mg/dL (ref 0.0–1.2)
Total Protein: 7.8 g/dL (ref 6.5–8.1)

## 2024-03-19 LAB — CBC WITH DIFFERENTIAL/PLATELET
Abs Immature Granulocytes: 0.1 K/uL — ABNORMAL HIGH (ref 0.00–0.07)
Basophils Absolute: 0.1 K/uL (ref 0.0–0.1)
Basophils Relative: 0 %
Eosinophils Absolute: 0 K/uL (ref 0.0–0.5)
Eosinophils Relative: 0 %
HCT: 48.8 % — ABNORMAL HIGH (ref 36.0–46.0)
Hemoglobin: 17.1 g/dL — ABNORMAL HIGH (ref 12.0–15.0)
Immature Granulocytes: 1 %
Lymphocytes Relative: 6 %
Lymphs Abs: 0.8 K/uL (ref 0.7–4.0)
MCH: 32 pg (ref 26.0–34.0)
MCHC: 35 g/dL (ref 30.0–36.0)
MCV: 91.2 fL (ref 80.0–100.0)
Monocytes Absolute: 0.6 K/uL (ref 0.1–1.0)
Monocytes Relative: 4 %
Neutro Abs: 12.3 K/uL — ABNORMAL HIGH (ref 1.7–7.7)
Neutrophils Relative %: 89 %
Platelets: 254 K/uL (ref 150–400)
RBC: 5.35 MIL/uL — ABNORMAL HIGH (ref 3.87–5.11)
RDW: 14 % (ref 11.5–15.5)
WBC: 13.8 K/uL — ABNORMAL HIGH (ref 4.0–10.5)
nRBC: 0 % (ref 0.0–0.2)

## 2024-03-19 LAB — RESP PANEL BY RT-PCR (FLU A&B, COVID) ARPGX2
Influenza A by PCR: NEGATIVE
Influenza B by PCR: NEGATIVE
SARS Coronavirus 2 by RT PCR: NEGATIVE

## 2024-03-19 LAB — URINALYSIS, ROUTINE W REFLEX MICROSCOPIC
Glucose, UA: NEGATIVE mg/dL
Hgb urine dipstick: NEGATIVE
Ketones, ur: 80 mg/dL — AB
Leukocytes,Ua: NEGATIVE
Nitrite: POSITIVE — AB
Protein, ur: 100 mg/dL — AB
Specific Gravity, Urine: 1.025 (ref 1.005–1.030)
pH: 5.5 (ref 5.0–8.0)

## 2024-03-19 LAB — URINALYSIS, MICROSCOPIC (REFLEX): RBC / HPF: NONE SEEN RBC/hpf (ref 0–5)

## 2024-03-19 MED ORDER — PREDNISONE 50 MG PO TABS
60.0000 mg | ORAL_TABLET | Freq: Once | ORAL | Status: AC
  Administered 2024-03-19: 60 mg via ORAL

## 2024-03-19 MED ORDER — KETOROLAC TROMETHAMINE 30 MG/ML IJ SOLN
15.0000 mg | Freq: Once | INTRAMUSCULAR | Status: AC
  Administered 2024-03-19: 15 mg via INTRAMUSCULAR

## 2024-03-19 MED ORDER — CEFDINIR 300 MG PO CAPS: 300.0000 mg | ORAL_CAPSULE | Freq: Two times a day (BID) | ORAL | 0 refills | Status: AC

## 2024-03-19 MED ORDER — ONDANSETRON 4 MG PO TBDP: 4.0000 mg | ORAL_TABLET | Freq: Three times a day (TID) | ORAL | 0 refills | Status: AC | PRN

## 2024-03-19 MED ORDER — DEXAMETHASONE SOD PHOSPHATE PF 10 MG/ML IJ SOLN: 10.0000 mg | Freq: Once | INTRAMUSCULAR | Status: DC

## 2024-03-19 MED ORDER — ONDANSETRON 8 MG PO TBDP
8.0000 mg | ORAL_TABLET | Freq: Once | ORAL | Status: AC
  Administered 2024-03-19: 8 mg via ORAL

## 2024-03-19 NOTE — ED Triage Notes (Signed)
 Pt c/o HA & emesis x1 day. States has been getting dizzy & bodyaches that started today. Denies any abd pain. Has tried OTC meds w/o relief.

## 2024-03-19 NOTE — Discharge Instructions (Signed)
 Your liver enzymes are elevated, but this could be due to your celiac disease.  I would recommend close follow-up with your primary care provider to have these rechecked in a week or 2 to make sure that they are going down.  Your urinalysis is suggestive of a UTI, so I am starting you on Omnicef for a week.  This will also take care of her sinus infection.  400 to 600 mg of ibuprofen 3-4 times a day as needed for pain.  No Tylenol  until your liver enzymes normalize.  Zofran  as needed for nausea and vomiting.  Go to the emergency department if things get worse, you start having fevers, neck stiffness, or for any other concerns.

## 2024-03-19 NOTE — ED Provider Notes (Signed)
 HPI  SUBJECTIVE:  Madeline Powell is a 73 y.o. female who reports a gradual onset sharp, constant headache on her forehead that radiates down both sides of her face starting yesterday.  She reports nausea, 6-7 episodes of vomiting due to the pain.  Reports more nasal congestion and rhinorrhea than usual, sinus pain and pressure, worse on the left.  States she is unable to tolerate any p.o., including water.  She reports phonophobia.  No fevers, neck stiffness, blurry or double vision, visual loss, photophobia, numbness/tingling/weakness in her face/arm/leg, discoordination, slurred speech, anorexia, abdominal pain, diarrhea.  No ear and jaw pain, facial swelling, dental pain, syncope, seizure.  No urinary complaints, back or pelvic pain.  She has a past medical history of fibromyalgia, asthma, frequent sinusitis, hypertension, celiac disease, UTI and migraine.  States this feels similar to previous migraines/sinus infections.  No history of temporal arteritis, CVA, aneurysm, atrial fibrillation, chronic kidney disease, diabetes.  [facility]P: [facility]   Past Medical History:  Diagnosis Date   Asthma    Fatty (change of) liver, not elsewhere classified 05/26/2021   Fibromyalgia    GERD (gastroesophageal reflux disease)    Headache    Hepatitis    not active   Hypertension     Past Surgical History:  Procedure Laterality Date   ABDOMINAL HYSTERECTOMY     REDUCTION MAMMAPLASTY Bilateral 02/2018   TUBAL LIGATION      Family History  Problem Relation Age of Onset   Breast cancer Neg Hx     Social History   Tobacco Use   Smoking status: Former    Current packs/day: 0.00    Types: Cigarettes    Quit date: 05/30/1991    Years since quitting: 32.8   Smokeless tobacco: Never  Vaping Use   Vaping status: Never Used  Substance Use Topics   Alcohol use: Yes    Alcohol/week: 4.0 standard drinks of alcohol    Types: 4 Glasses of wine per week    Comment: occassional   Drug use: Never     No current facility-administered medications for this encounter.  Current Outpatient Medications:    cefdinir (OMNICEF) 300 MG capsule, Take 1 capsule (300 mg total) by mouth 2 (two) times daily for 7 days., Disp: 14 capsule, Rfl: 0   ondansetron  (ZOFRAN -ODT) 4 MG disintegrating tablet, Take 1 tablet (4 mg total) by mouth every 8 (eight) hours as needed for nausea or vomiting., Disp: 20 tablet, Rfl: 0   albuterol (VENTOLIN HFA) 108 (90 Base) MCG/ACT inhaler, Inhale 2 puffs into the lungs every 6 (six) hours as needed for wheezing or shortness of breath., Disp: , Rfl:    betamethasone valerate (VALISONE) 0.1 % cream, Apply 1 application topically 2 (two) times daily as needed (irritation)., Disp: , Rfl:    buPROPion (WELLBUTRIN SR) 150 MG 12 hr tablet, Take 150 mg by mouth 2 (two) times daily., Disp: , Rfl:    Calcium Carb-Cholecalciferol 600-400 MG-UNIT TABS, Take 1 tablet by mouth in the morning and at bedtime., Disp: , Rfl:    dorzolamide-timolol (COSOPT) 22.3-6.8 MG/ML ophthalmic solution, Place 1 drop into both eyes 2 (two) times daily., Disp: , Rfl:    estradiol (ESTRACE) 1 MG tablet, Take 0.5 mg by mouth daily., Disp: , Rfl:    FLOVENT HFA 110 MCG/ACT inhaler, Inhale 2 puffs into the lungs in the morning and at bedtime., Disp: , Rfl:    fluticasone (FLONASE) 50 MCG/ACT nasal spray, Place 1 spray into the nose  daily as needed for allergies., Disp: , Rfl:    gabapentin  (NEURONTIN ) 100 MG capsule, Take 100 mg by mouth 3 (three) times daily., Disp: , Rfl:    hydrochlorothiazide (HYDRODIURIL) 25 MG tablet, Take 25 mg by mouth daily., Disp: , Rfl:    ketoconazole (NIZORAL) 2 % cream, Apply 1 application topically daily as needed for irritation., Disp: , Rfl:    levocetirizine (XYZAL) 5 MG tablet, Take 5 mg by mouth every evening., Disp: , Rfl:    loratadine (CLARITIN) 10 MG tablet, Take 10 mg by mouth daily., Disp: , Rfl:    montelukast (SINGULAIR) 10 MG tablet, Take 10 mg by mouth at  bedtime., Disp: , Rfl:    Omega-3 Fatty Acids (FISH OIL) 1000 MG CAPS, Take 1,000 mg by mouth daily., Disp: , Rfl:    omeprazole (PRILOSEC) 40 MG capsule, Take 40 mg by mouth daily., Disp: , Rfl:    Probiotic Product (PROBIOTIC DAILY PO), Take 1 capsule by mouth daily., Disp: , Rfl:    RESTASIS 0.05 % ophthalmic emulsion, Place 1 drop into both eyes 2 (two) times daily as needed (dry eyes)., Disp: , Rfl:   Allergies  Allergen Reactions   Dilaudid [Hydromorphone Hcl] Nausea And Vomiting   Morphine And Codeine Nausea And Vomiting     ROS  As noted in HPI.   Physical Exam  BP (!) 146/78 (BP Location: Right Arm)   Pulse 96   Temp 98.9 F (37.2 C) (Oral)   Resp 16   Ht 5' 2 (1.575 m)   Wt 70.3 kg   SpO2 95%   BMI 28.35 kg/m   Constitutional: Well developed, well nourished, appears uncomfortable Eyes: PERRL, EOMI, conjunctiva normal bilaterally.  Mild bilateral photophobia. HENT: Normocephalic, atraumatic,mucus membranes moist, normal dentition.  TM normal b/l. No TMJ tenderness.  Mild nasal congestion, positive frontal/maxillary sinus tenderness. No temporal artery tenderness.  Neck: no cervical LN. bilateral trapezial muscle tenderness. No meningismus Respiratory: normal inspiratory effort Cardiovascular: Normal rate, regular rhythm GI:  nondistended, soft, no suprapubic, flank tenderness Back: No CVAT skin: No rash, skin intact Musculoskeletal: No edema, no tenderness, no deformities Neurologic: Alert & oriented x 3, CN III-XII intact, romberg neg, finger-> nose, heel-> shin equal b/l, Romberg neg, tandem gait steady Psychiatric: Speech and behavior appropriate   ED Course  Medications  ondansetron  (ZOFRAN -ODT) disintegrating tablet 8 mg (8 mg Oral Given 03/19/24 1614)  ketorolac (TORADOL) 30 MG/ML injection 15 mg (15 mg Intramuscular Given 03/19/24 1718)  predniSONE (DELTASONE) tablet 60 mg (60 mg Oral Given 03/19/24 1716)    Orders Placed This Encounter   Procedures   Resp Panel by RT-PCR (Flu A&B, Covid) Anterior Nasal Swab    Standing Status:   Standing    Number of Occurrences:   1   Urine Culture    Standing Status:   Standing    Number of Occurrences:   1    Indication:   Urgency/frequency   CBC with Differential    Standing Status:   Standing    Number of Occurrences:   1   Comprehensive metabolic panel    Standing Status:   Standing    Number of Occurrences:   1   Urinalysis, Routine w reflex microscopic -Urine, Clean Catch    Standing Status:   Standing    Number of Occurrences:   1    Specimen Source:   Urine, Clean Catch [76]   Urinalysis, Microscopic (reflex)    Standing Status:  Standing    Number of Occurrences:   1   Results for orders placed or performed during the hospital encounter of 03/19/24 (from the past 24 hours)  Resp Panel by RT-PCR (Flu A&B, Covid) Anterior Nasal Swab     Status: None   Collection Time: 03/19/24  3:21 PM   Specimen: Anterior Nasal Swab  Result Value Ref Range   SARS Coronavirus 2 by RT PCR NEGATIVE NEGATIVE   Influenza A by PCR NEGATIVE NEGATIVE   Influenza B by PCR NEGATIVE NEGATIVE  CBC with Differential     Status: Abnormal   Collection Time: 03/19/24  5:13 PM  Result Value Ref Range   WBC 13.8 (H) 4.0 - 10.5 K/uL   RBC 5.35 (H) 3.87 - 5.11 MIL/uL   Hemoglobin 17.1 (H) 12.0 - 15.0 g/dL   HCT 51.1 (H) 63.9 - 53.9 %   MCV 91.2 80.0 - 100.0 fL   MCH 32.0 26.0 - 34.0 pg   MCHC 35.0 30.0 - 36.0 g/dL   RDW 85.9 88.4 - 84.4 %   Platelets 254 150 - 400 K/uL   nRBC 0.0 0.0 - 0.2 %   Neutrophils Relative % 89 %   Neutro Abs 12.3 (H) 1.7 - 7.7 K/uL   Lymphocytes Relative 6 %   Lymphs Abs 0.8 0.7 - 4.0 K/uL   Monocytes Relative 4 %   Monocytes Absolute 0.6 0.1 - 1.0 K/uL   Eosinophils Relative 0 %   Eosinophils Absolute 0.0 0.0 - 0.5 K/uL   Basophils Relative 0 %   Basophils Absolute 0.1 0.0 - 0.1 K/uL   Immature Granulocytes 1 %   Abs Immature Granulocytes 0.10 (H) 0.00 - 0.07  K/uL  Comprehensive metabolic panel     Status: Abnormal   Collection Time: 03/19/24  5:13 PM  Result Value Ref Range   Sodium 135 135 - 145 mmol/L   Potassium 4.0 3.5 - 5.1 mmol/L   Chloride 101 98 - 111 mmol/L   CO2 25 22 - 32 mmol/L   Glucose, Bld 119 (H) 70 - 99 mg/dL   BUN 15 8 - 23 mg/dL   Creatinine, Ser 9.15 0.44 - 1.00 mg/dL   Calcium 9.1 8.9 - 89.6 mg/dL   Total Protein 7.8 6.5 - 8.1 g/dL   Albumin 3.9 3.5 - 5.0 g/dL   AST 876 (H) 15 - 41 U/L   ALT 99 (H) 0 - 44 U/L   Alkaline Phosphatase 132 (H) 38 - 126 U/L   Total Bilirubin 1.0 0.0 - 1.2 mg/dL   GFR, Estimated >39 >39 mL/min   Anion gap 9 5 - 15  Urinalysis, Routine w reflex microscopic -Urine, Clean Catch     Status: Abnormal   Collection Time: 03/19/24  5:13 PM  Result Value Ref Range   Color, Urine AMBER (A) YELLOW   APPearance CLEAR CLEAR   Specific Gravity, Urine 1.025 1.005 - 1.030   pH 5.5 5.0 - 8.0   Glucose, UA NEGATIVE NEGATIVE mg/dL   Hgb urine dipstick NEGATIVE NEGATIVE   Bilirubin Urine MODERATE (A) NEGATIVE   Ketones, ur 80 (A) NEGATIVE mg/dL   Protein, ur 899 (A) NEGATIVE mg/dL   Nitrite POSITIVE (A) NEGATIVE   Leukocytes,Ua NEGATIVE NEGATIVE  Urinalysis, Microscopic (reflex)     Status: Abnormal   Collection Time: 03/19/24  5:13 PM  Result Value Ref Range   RBC / HPF NONE SEEN 0 - 5 RBC/hpf   WBC, UA 11-20 0 - 5 WBC/hpf  Bacteria, UA MANY (A) NONE SEEN   Squamous Epithelial / HPF 0-5 0 - 5 /HPF   WBC Clumps PRESENT    WBC Casts, UA PRESENT    No results found.   ED Clinical Impression  1. Acute nonintractable headache, unspecified headache type   2. Acute non-recurrent sinusitis, unspecified location   3. Bacteriuria   4. Elevated liver enzymes     ED Assessment/Plan    Outside labs, records reviewed.  As noted in HPI  no sudden onset. Doubt SAH, ICH or space occupying lesion. Pt without fevers/chills, Pt has no meningeal sx, no nuchal rigidity. Doubt meningitis. Pt with  normal neuro exam, no evidence of CVA/TIA.  Pt BP not elevated significantly, doubt hypertensive emergency. No evidence of temporal artery tenderness, no evidence of glaucoma or other ocular pathology. Will give headache cocktail  (zofran  8 po,  toradol 15 IM), will try 60 mg of prednisone as we are out of dexamethasone , and reassess.  Will try some electrolyte containing fluids as well.  Will check CBC, CMP, UA to address for electrolyte's, kidney function and hydration status due to age and multiple episodes of vomiting/inability to tolerate p.o.  Last calculated creatinine clearance on outside labs done in December 2024 78 mL/min.  Labs reviewed.  She has a leukocytosis of 13.8, but this could be reactive from the vomiting.  She is hemoconcentrated consistent with dehydration.  She has elevated liver enzymes, which is new compared to outside labs done in December 2024.  This could be from celiac disease.  Her urine shows dehydration, moderate bilirubin, positive nitrite, many bacteria, WBC clumps and casts.  Will send this off for culture.  She has no urinary complaints, but I wonder if she has UTI that could be contributing to her headache.  COVID, flu negative.  We can try treating this as a sinusitis given that she has sinus tenderness and a history of frequent sinusitis with Omnicef 300 mg p.o. twice daily for 7 days.  This will also take care of any complicated UTI.  Will send home with ibuprofen 400 mg 3 times daily, Zofran  4 mg 3 times daily as needed.  Today's calculated creatinine clearance 65 mL/min.  No Tylenol  because of the elevated liver enzymes.    Advised close follow-up with PCP to have liver enzymes rechecked.  Strict ER return precautions given.  Pt much improved after medications. Pt with continued non-focal neuro exam. Discussed labs, MDM, plan for follow up, signs and sx that should prompt return to ER. Pt agrees with plan  Meds ordered this encounter  Medications    ondansetron  (ZOFRAN -ODT) disintegrating tablet 8 mg   ketorolac (TORADOL) 30 MG/ML injection 15 mg   DISCONTD: dexamethasone  (DECADRON ) injection 10 mg   predniSONE (DELTASONE) tablet 60 mg   cefdinir (OMNICEF) 300 MG capsule    Sig: Take 1 capsule (300 mg total) by mouth 2 (two) times daily for 7 days.    Dispense:  14 capsule    Refill:  0   ondansetron  (ZOFRAN -ODT) 4 MG disintegrating tablet    Sig: Take 1 tablet (4 mg total) by mouth every 8 (eight) hours as needed for nausea or vomiting.    Dispense:  20 tablet    Refill:  0    *This clinic note was created using Scientist, clinical (histocompatibility and immunogenetics). Therefore, there may be occasional mistakes despite careful proofreading.  ?    Van Knee, MD 03/20/24 [phone]

## 2024-03-21 ENCOUNTER — Ambulatory Visit (HOSPITAL_COMMUNITY): Payer: Self-pay

## 2024-03-21 LAB — URINE CULTURE
